# Patient Record
Sex: Female | Born: 1950 | Race: White | Hispanic: No | Smoking: Former smoker
Health system: Southern US, Community
[De-identification: ages and names within clinical notes are randomized; demographics above are authoritative.]

## PROBLEM LIST (undated history)

## (undated) DIAGNOSIS — M1990 Unspecified osteoarthritis, unspecified site: Secondary | ICD-10-CM

## (undated) DIAGNOSIS — K8020 Calculus of gallbladder without cholecystitis without obstruction: Secondary | ICD-10-CM

## (undated) DIAGNOSIS — K219 Gastro-esophageal reflux disease without esophagitis: Secondary | ICD-10-CM

## (undated) DIAGNOSIS — C50919 Malignant neoplasm of unspecified site of unspecified female breast: Secondary | ICD-10-CM

## (undated) DIAGNOSIS — K635 Polyp of colon: Secondary | ICD-10-CM

## (undated) DIAGNOSIS — K5792 Diverticulitis of intestine, part unspecified, without perforation or abscess without bleeding: Secondary | ICD-10-CM

## (undated) DIAGNOSIS — R519 Headache, unspecified: Secondary | ICD-10-CM

## (undated) DIAGNOSIS — R51 Headache: Secondary | ICD-10-CM

## (undated) DIAGNOSIS — T7840XA Allergy, unspecified, initial encounter: Secondary | ICD-10-CM

## (undated) DIAGNOSIS — E669 Obesity, unspecified: Secondary | ICD-10-CM

## (undated) HISTORY — PX: CHOLECYSTECTOMY: SHX55

## (undated) HISTORY — DX: Allergy, unspecified, initial encounter: T78.40XA

## (undated) HISTORY — DX: Malignant neoplasm of unspecified site of unspecified female breast: C50.919

## (undated) HISTORY — DX: Calculus of gallbladder without cholecystitis without obstruction: K80.20

## (undated) HISTORY — PX: MASTECTOMY: SHX3

## (undated) HISTORY — PX: TUBAL LIGATION: SHX77

## (undated) HISTORY — PX: ABDOMINAL HYSTERECTOMY: SHX81

## (undated) HISTORY — DX: Obesity, unspecified: E66.9

## (undated) HISTORY — DX: Diverticulitis of intestine, part unspecified, without perforation or abscess without bleeding: K57.92

## (undated) HISTORY — DX: Gastro-esophageal reflux disease without esophagitis: K21.9

## (undated) HISTORY — DX: Polyp of colon: K63.5

## (undated) HISTORY — DX: Unspecified osteoarthritis, unspecified site: M19.90

---

## 1998-05-24 ENCOUNTER — Other Ambulatory Visit: Admission: RE | Admit: 1998-05-24 | Discharge: 1998-05-24 | Payer: Self-pay | Admitting: Obstetrics and Gynecology

## 1998-07-15 ENCOUNTER — Encounter: Payer: Self-pay | Admitting: Internal Medicine

## 1998-07-15 ENCOUNTER — Ambulatory Visit (HOSPITAL_COMMUNITY): Admission: RE | Admit: 1998-07-15 | Discharge: 1998-07-15 | Payer: Self-pay | Admitting: Internal Medicine

## 1998-11-16 ENCOUNTER — Other Ambulatory Visit: Admission: RE | Admit: 1998-11-16 | Discharge: 1998-11-16 | Payer: Self-pay | Admitting: Obstetrics and Gynecology

## 1999-02-13 ENCOUNTER — Encounter (INDEPENDENT_AMBULATORY_CARE_PROVIDER_SITE_OTHER): Payer: Self-pay | Admitting: Specialist

## 1999-02-13 ENCOUNTER — Other Ambulatory Visit: Admission: RE | Admit: 1999-02-13 | Discharge: 1999-02-13 | Payer: Self-pay | Admitting: Obstetrics and Gynecology

## 1999-03-16 ENCOUNTER — Encounter: Payer: Self-pay | Admitting: Family Medicine

## 1999-03-16 ENCOUNTER — Encounter: Admission: RE | Admit: 1999-03-16 | Discharge: 1999-03-16 | Payer: Self-pay | Admitting: Family Medicine

## 1999-04-07 ENCOUNTER — Encounter: Admission: RE | Admit: 1999-04-07 | Discharge: 1999-04-07 | Payer: Self-pay | Admitting: Family Medicine

## 1999-04-07 ENCOUNTER — Encounter: Payer: Self-pay | Admitting: Family Medicine

## 1999-04-19 ENCOUNTER — Other Ambulatory Visit: Admission: RE | Admit: 1999-04-19 | Discharge: 1999-04-19 | Payer: Self-pay | Admitting: Family Medicine

## 1999-06-30 ENCOUNTER — Encounter (INDEPENDENT_AMBULATORY_CARE_PROVIDER_SITE_OTHER): Payer: Self-pay | Admitting: Specialist

## 1999-06-30 ENCOUNTER — Ambulatory Visit (HOSPITAL_COMMUNITY): Admission: RE | Admit: 1999-06-30 | Discharge: 1999-06-30 | Payer: Self-pay | Admitting: Gastroenterology

## 1999-09-08 ENCOUNTER — Encounter: Admission: RE | Admit: 1999-09-08 | Discharge: 1999-09-08 | Payer: Self-pay | Admitting: Family Medicine

## 1999-09-08 ENCOUNTER — Encounter: Payer: Self-pay | Admitting: Family Medicine

## 1999-09-26 ENCOUNTER — Other Ambulatory Visit: Admission: RE | Admit: 1999-09-26 | Discharge: 1999-09-26 | Payer: Self-pay | Admitting: Family Medicine

## 2000-12-10 ENCOUNTER — Other Ambulatory Visit: Admission: RE | Admit: 2000-12-10 | Discharge: 2000-12-10 | Payer: Self-pay | Admitting: Obstetrics and Gynecology

## 2001-07-08 ENCOUNTER — Encounter: Payer: Self-pay | Admitting: Internal Medicine

## 2002-12-23 ENCOUNTER — Encounter: Payer: Self-pay | Admitting: Internal Medicine

## 2003-01-22 ENCOUNTER — Ambulatory Visit (HOSPITAL_COMMUNITY): Admission: RE | Admit: 2003-01-22 | Discharge: 2003-01-22 | Payer: Self-pay | Admitting: Internal Medicine

## 2003-01-22 ENCOUNTER — Encounter (INDEPENDENT_AMBULATORY_CARE_PROVIDER_SITE_OTHER): Payer: Self-pay | Admitting: Specialist

## 2004-08-17 ENCOUNTER — Ambulatory Visit: Payer: Self-pay | Admitting: Internal Medicine

## 2006-08-12 ENCOUNTER — Ambulatory Visit (HOSPITAL_COMMUNITY): Admission: RE | Admit: 2006-08-12 | Discharge: 2006-08-12 | Payer: Self-pay | Admitting: Family Medicine

## 2006-11-14 ENCOUNTER — Ambulatory Visit (HOSPITAL_COMMUNITY): Admission: RE | Admit: 2006-11-14 | Discharge: 2006-11-14 | Payer: Self-pay | Admitting: Internal Medicine

## 2006-11-14 ENCOUNTER — Encounter (INDEPENDENT_AMBULATORY_CARE_PROVIDER_SITE_OTHER): Payer: Self-pay | Admitting: *Deleted

## 2006-11-29 ENCOUNTER — Encounter (INDEPENDENT_AMBULATORY_CARE_PROVIDER_SITE_OTHER): Payer: Self-pay | Admitting: General Surgery

## 2006-11-29 ENCOUNTER — Observation Stay (HOSPITAL_COMMUNITY): Admission: RE | Admit: 2006-11-29 | Discharge: 2006-11-30 | Payer: Self-pay | Admitting: General Surgery

## 2007-08-26 ENCOUNTER — Other Ambulatory Visit: Admission: RE | Admit: 2007-08-26 | Discharge: 2007-08-26 | Payer: Self-pay | Admitting: Emergency Medicine

## 2007-08-28 ENCOUNTER — Ambulatory Visit (HOSPITAL_COMMUNITY): Admission: RE | Admit: 2007-08-28 | Discharge: 2007-08-28 | Payer: Self-pay | Admitting: Internal Medicine

## 2008-03-08 ENCOUNTER — Telehealth: Payer: Self-pay | Admitting: Internal Medicine

## 2008-03-25 DIAGNOSIS — K219 Gastro-esophageal reflux disease without esophagitis: Secondary | ICD-10-CM | POA: Insufficient documentation

## 2008-03-29 ENCOUNTER — Ambulatory Visit: Payer: Self-pay | Admitting: Internal Medicine

## 2009-02-23 ENCOUNTER — Ambulatory Visit: Payer: Self-pay | Admitting: Internal Medicine

## 2009-04-12 ENCOUNTER — Encounter (INDEPENDENT_AMBULATORY_CARE_PROVIDER_SITE_OTHER): Payer: Self-pay | Admitting: *Deleted

## 2009-05-31 ENCOUNTER — Telehealth: Payer: Self-pay | Admitting: Internal Medicine

## 2009-07-04 ENCOUNTER — Encounter (INDEPENDENT_AMBULATORY_CARE_PROVIDER_SITE_OTHER): Payer: Self-pay

## 2009-07-05 ENCOUNTER — Ambulatory Visit: Payer: Self-pay | Admitting: Internal Medicine

## 2009-07-12 ENCOUNTER — Ambulatory Visit: Payer: Self-pay | Admitting: Internal Medicine

## 2009-07-13 ENCOUNTER — Encounter: Payer: Self-pay | Admitting: Internal Medicine

## 2010-02-27 ENCOUNTER — Telehealth: Payer: Self-pay | Admitting: Internal Medicine

## 2010-03-22 ENCOUNTER — Encounter (INDEPENDENT_AMBULATORY_CARE_PROVIDER_SITE_OTHER): Payer: Self-pay | Admitting: Obstetrics and Gynecology

## 2010-03-22 ENCOUNTER — Ambulatory Visit (HOSPITAL_COMMUNITY)
Admission: RE | Admit: 2010-03-22 | Discharge: 2010-03-23 | Payer: Self-pay | Source: Home / Self Care | Attending: Obstetrics and Gynecology | Admitting: Obstetrics and Gynecology

## 2010-04-30 ENCOUNTER — Encounter: Payer: Self-pay | Admitting: Family Medicine

## 2010-05-11 NOTE — Letter (Signed)
Summary: [facility] Instructions  [name]  [address] [place], [place] [address]   Phone: [phone]  Fax: [phone]       Shannon [name]    [birth year]    MRN: [id number]       Procedure Day Dorna Bloom:  Jake Shark  07/12/09     Arrival Time: 10:00AM     Procedure Time:  11:00AM     Location of Procedure:                    _ X_  [facility] (4th Floor)   PREPARATION FOR COLONOSCOPY WITH MIRALAX  Starting 5 days prior to your procedure 07/07/09 do not eat nuts, seeds, popcorn, corn, beans, peas,  salads, or any raw vegetables.  Do not take any fiber supplements (e.g. Metamucil, Citrucel, and Benefiber). ____________________________________________________________________________________________________   THE DAY BEFORE YOUR PROCEDURE         DATE: 07/11/09 DAY: MONDAY  1   Drink clear liquids the entire day-NO SOLID FOOD  2   Do not drink anything colored red or purple.  Avoid juices with pulp.  No orange juice.  3   Drink at least 64 oz. (8 glasses) of fluid/clear liquids during the day to prevent dehydration and help the prep work efficiently.  CLEAR LIQUIDS INCLUDE: Water Jello Ice Popsicles Tea (sugar ok, no milk/cream) Powdered fruit flavored drinks Coffee (sugar ok, no milk/cream) Gatorade Juice: apple, white grape, white cranberry  Lemonade Clear bullion, consomm, broth Carbonated beverages (any kind) Strained chicken noodle soup Hard Candy  4   Mix the entire bottle of Miralax with 64 oz. of Gatorade/Powerade in the morning and put in the refrigerator to chill.  5   At 3:00 pm take 2 Dulcolax/Bisacodyl tablets.  6   At 4:30 pm take one Reglan/Metoclopramide tablet.  7  Starting at 5:00 pm drink one 8 oz glass of the Miralax mixture every 15-20 minutes until you have finished drinking the entire 64 oz.  You should finish drinking prep around 7:30 or 8:00 pm.  8   If you are nauseated, you may take the 2nd Reglan/Metoclopramide  tablet at 6:30 pm.        9    At 8:00 pm take 2 more DULCOLAX/Bisacodyl tablets.     THE DAY OF YOUR PROCEDURE      DATE: 07/12/09  DAY: Jake Shark  You may drink clear liquids until 9:00AM (2 HOURS BEFORE PROCEDURE).   MEDICATION INSTRUCTIONS  Unless otherwise instructed, you should take regular prescription medications with a small sip of water as early as possible the morning of your procedure.          OTHER INSTRUCTIONS  You will need a responsible adult at least 59 years of age to accompany you and drive you home.   This person must remain in the waiting room during your procedure.  Wear loose fitting clothing that is easily removed.  Leave jewelry and other valuables at home.  However, you may wish to bring a book to read or an iPod/MP3 player to listen to music as you wait for your procedure to start.  Remove all body piercing jewelry and leave at home.  Total time from sign-in until discharge is approximately 2-3 hours.  You should go home directly after your procedure and rest.  You can resume normal activities the day after your procedure.  The day of your procedure you should not:   Drive   Make legal  decisions   Operate machinery   Drink alcohol   Return to work  You will receive specific instructions about eating, activities and medications before you leave.   The above instructions have been reviewed and explained to me by   Ulis Rias RN  July 05, 2009 5:14 PM     I fully understand and can verbalize these instructions _____________________________ Date _______

## 2010-05-11 NOTE — Letter (Signed)
Summary: Patient Notice-Endo Biopsy Results  [facility]  [address] [place] [address] [place], [place] [address]   Phone: [phone]  Fax: [phone]        July 13, 2009 MRN: [id number]    [name] [address] [place], [place]  [address]    Dear Shannon [name],  I am pleased to inform you that the biopsies taken during your recent endoscopic examination did not show any evidence of cancer upon pathologic examination.  Additional information/recommendations:  __No further action is needed at this time.  Please follow-up with      your primary care physician for your other healthcare needs.  __ Please call [phone] to schedule a return visit to review      your condition.  _x_ Continue with the treatment plan as outlined on the day of your      exam.     Please call us if you are having persistent problems or have questions about your condition that have not been fully answered at this time.  Sincerely,  Hart Carwin MD  This letter has been electronically signed by your physician.  Appended Document: Patient Notice-Endo Biopsy Results Letter mailed 4.8.11

## 2010-05-11 NOTE — Progress Notes (Signed)
Summary: TRIAGE-Scheduled Endo/Colon  Phone Note Call from Patient Call back at Home Phone [phone]   Caller: Patient Call For: Dr. Juanda Chance Reason for Call: Talk to Nurse Summary of Call: pt wants to talk to Dr. Juanda Chance about why she received a letter for her to sch a COL now, thought she wasnt due until later this year... checked in IDX and confirmed with pt that she isnt due until October... pt took this as me saying she couldnt speak with Dr. Juanda Chance and said I wasnt being helpful... apologized to pt and told her that I would be happy to take a mesg for Dr. Juanda Chance, I was just checking what our computer said as far as her COL due date... I then asked pt if she was experiencing any GI concerns right now and again pt became short with me and said "I just want to speak to Dr. Juanda Chance" Initial call taken by: Vallarie Mare,  May 31, 2009 1:59 PM  Follow-up for Phone Call        Pt. was seen in the office 02-23-2009. States she discussed having an Endo/Colon with Dr.Metha Kolasa, wants to know if she can get both procedures scheduled now.  DR.Princetta Uplinger PLEASE REVIEW AND ADVISE  Follow-up by: Laureen Ochs LPN,  May 31, 2009 2:06 PM  Additional Follow-up for Phone Call Additional follow up Details #1::        Per Dr.Kyland No, pt. may have an Endo/Colon, pt. wants it around 07-08-09. I will call her after 1pm today, she works nights and doesn't want to be disturbed until then. [phone]. Additional Follow-up by: Laureen Ochs LPN,  June 01, 2009 8:10 AM    Additional Follow-up for Phone Call Additional follow up Details #2::    Pt. scheduled her previsit for 07-05-09 at 4:30pm and her Endo/Colon in [facility] on 07-12-09 at 11am. Pt. instructed to call back as needed.  Follow-up by: Laureen Ochs LPN,  June 01, 2009 3:15 PM

## 2010-05-11 NOTE — Letter (Signed)
Summary: Patient Notice- Polyp Results  [facility]  [address] [place], [place] [address]   Phone: [phone]  Fax: [phone]        July 13, 2009 MRN: [id number]    Shannon [name] [address] [place], [place]  [address]    Dear Ms. Shannon [name],  I am pleased to inform you that the colon polyp(s) removed during your recent colonoscopy was (were) found to be benign (no cancer detected) upon pathologic examination.The polyps were hyperplastic ( not precancerous)  I recommend you have a repeat colonoscopy examination in 7_ years to look for recurrent polyps, as having colon polyps increases your risk for having recurrent polyps or even colon cancer in the future.  Should you develop new or worsening symptoms of abdominal pain, bowel habit changes or bleeding from the rectum or bowels, please schedule an evaluation with either your primary care physician or with me.  Additional information/recommendations:  _x_ No further action with gastroenterology is needed at this time. Please      follow-up with your primary care physician for your other healthcare      needs.  __ Please call [phone] to schedule a return visit to review your      situation.  __ Please keep your follow-up visit as already scheduled.  __ Continue treatment plan as outlined the day of your exam.  Please call us if you are having persistent problems or have questions about your condition that have not been fully answered at this time.  Sincerely,  Hart Carwin MD  This letter has been electronically signed by your physician.  Appended Document: Patient Notice- Polyp Results Letter mailed 4.8.11

## 2010-05-11 NOTE — Progress Notes (Signed)
Summary: Refill  Phone Note Call from Patient Call back at Home Phone [phone]   Caller: Patient Call For: Dr. Juanda Chance Reason for Call: Refill Medication Details for Reason: Refill Summary of Call: Needs Nexium refilled for one year. [facility] [address]  Please call pt. if this can be done. Initial call taken by: Schuyler Amor,  February 27, 2010 10:27 AM  Follow-up for Phone Call        Prescription for Nexium has been sent with 10 refills. I have left a message on patient's voicemail that this has been done. Before she runs out of her last refill, she will need to schedule an appointment with Dr Juanda Chance for a routine follow up. Follow-up by: Lamona Curl CMA Duncan Dull),  February 27, 2010 10:39 AM    New/Updated Medications: NEXIUM 40 MG CPDR (ESOMEPRAZOLE MAGNESIUM) Take 1 tablet by mouth once a day Prescriptions: NEXIUM 40 MG CPDR (ESOMEPRAZOLE MAGNESIUM) Take 1 tablet by mouth once a day  #30 x 10   Entered by:   Lamona Curl CMA (AAMA)   Authorized by:   Hart Carwin MD   Signed by:   Lamona Curl CMA (AAMA) on 02/27/2010   Method used:   [facility]  [phone]* (retail)       [address] [place], [place]  [address]       Ph: [phone] or [phone]       Fax: [phone]   RxID:   [id number]

## 2010-05-11 NOTE — Procedures (Signed)
Summary: Upper Endoscopy  Patient: Shannon [name] Note: All result statuses are Final unless otherwise noted.  Tests: (1) Upper Endoscopy (EGD)   EGD Upper Endoscopy       DONE     [facility]     [address]     [place], [place]  [address]           ENDOSCOPY PROCEDURE REPORT           PATIENT:  [name], [name]  MR#:  [id number]     BIRTHDATE:  1950-05-31, 59 yrs. old  GENDER:  female           ENDOSCOPIST:  Hedwig Morton. Juanda Chance, MD     Referred by:  Lupita Raider, M.D.           PROCEDURE DATE:  07/12/2009     PROCEDURE:  EGD with biopsy     ASA CLASS:  Class II     INDICATIONS:  GERD, heartburn EGD 2000 for GERD, currently     controlled on Nexiem 40 mg qd           MEDICATIONS:   Versed 7 mg, Fentanyl 50 mcg     TOPICAL ANESTHETIC:  Exactacain Spray           DESCRIPTION OF PROCEDURE:   After the risks benefits and     alternatives of the procedure were thoroughly explained, informed     consent was obtained.  The LB GIF-H180 [id number] endoscope was     introduced through the mouth and advanced to the second portion of     the duodenum, without limitations.  The instrument was slowly     withdrawn as the mucosa was fully examined.     <<PROCEDUREIMAGES>>           A hiatal hernia was found (see image1). 2 cm reducible hiatal     hernia  Normal GE junction was noted. With standard forceps, a     biopsy was obtained and sent to pathology (see image2, image7, and     image6).  Mild gastritis was found in the body and the antrum of     the stomach. erythema and patchiness With standard forceps, a     biopsy was obtained and sent to pathology (see image4 and image5).     Otherwise the examination was normal (see image3).    Retroflexed     views revealed no abnormalities.    The scope was then withdrawn     from the patient and the procedure completed.           COMPLICATIONS:  None           ENDOSCOPIC IMPRESSION:     1) Hiatal hernia     2) Normal GE  junction     3) Mild gastritis in the body and the antrum of the stomach     4) Otherwise normal examination     small reducible hernia, symptoms of GERD control on Nexiem 40 mg     qd     RECOMMENDATIONS:     1) Anti-reflux regimen to be follow     2) Await biopsy results     nexiem 40 mg po qd           REPEAT EXAM:  In 0 year(s) for.           ______________________________     Hedwig Morton. Juanda Chance, MD  CC:           n.     eSIGNED:   Neola Worrall M. Santos Sollenberger at 07/12/2009 12:06 PM           Manvi, Guilliams, [id number]  Note: An exclamation mark (!) indicates a result that was not dispersed into the flowsheet. Document Creation Date: 07/12/2009 12:07 PM _______________________________________________________________________  (1) Order result status: Final Collection or observation date-time: 07/12/2009 11:34 Requested date-time:  Receipt date-time:  Reported date-time:  Referring Physician:   Ordering Physician: Lina Sar [phone]) Specimen Source:  Source: Launa Grill Order Number: [id number] Lab site:

## 2010-05-11 NOTE — Letter (Signed)
Summary: Colonoscopy Date Change Letter  [facility]  [address] [place], [place] [address]   Phone: [phone]  Fax: [phone]      April 12, 2009 MRN: [id number]   RABIAH GOESER [address] [place], [place]  [address]   Dear Ms. Rogelia Mire,   Previously you were recommended to have a repeat colonoscopy around this time. Your chart was recently reviewed by Dr. Hedwig Morton. Juanda Chance of [facility]. Follow up colonoscopy is now recommended in October 2011. This revised recommendation is based on current, nationally recognized guidelines for colorectal cancer screening and polyp surveillance. These guidelines are endorsed by the American Cancer Society, [employer] on Colorectal Cancer as well as numerous other major medical organizations.  Please understand that our recommendation assumes that you do not have any new symptoms such as bleeding, a change in bowel habits, anemia, or significant abdominal discomfort. If you do have any concerning GI symptoms or want to discuss the guideline recommendations, please call to arrange an office visit at your earliest convenience. Otherwise we will keep you in our reminder system and contact you 1-2 months prior to the date listed above to schedule your next colonoscopy.  Thank you,  Hedwig Morton. Juanda Chance, M.D.  [facility] [phone]

## 2010-05-11 NOTE — Miscellaneous (Signed)
Summary: [facility] previsit  Clinical Lists Changes  Medications: Added new medication of MIRALAX   POWD (POLYETHYLENE GLYCOL 3350) As per prep  instructions. - Signed Added new medication of REGLAN 10 MG  TABS (METOCLOPRAMIDE HCL) As per prep instructions. - Signed Added new medication of DULCOLAX 5 MG  TBEC (BISACODYL) Day before procedure take 2 at 3pm and 2 at 8pm. - Signed Rx of MIRALAX   POWD (POLYETHYLENE GLYCOL 3350) As per prep  instructions.;  #255gm x 0;  Signed;  Entered by: Ulis Rias RN;  Authorized by: Hart Carwin MD;  Method used: [facility]  [phone]*, [address], [place], [place]  [address], Ph: [phone] or [phone], Fax: [phone] Rx of REGLAN 10 MG  TABS (METOCLOPRAMIDE HCL) As per prep instructions.;  #2 x 0;  Signed;  Entered by: Ulis Rias RN;  Authorized by: Hart Carwin MD;  Method used: Electronically to [facility]  [phone]*, [address], [place], [place]  [address], Ph: [phone] or [phone], Fax: [phone] Rx of DULCOLAX 5 MG  TBEC (BISACODYL) Day before procedure take 2 at 3pm and 2 at 8pm.;  #4 x 0;  Signed;  Entered by: Ulis Rias RN;  Authorized by: Hart Carwin MD;  Method used: Electronically to [facility]  [phone]*, [address], [place], [place]  [address], Ph: [phone] or [phone], Fax: [phone] Observations: Added new observation of ALLERGY REV: Done (07/05/2009 16:43)    Prescriptions: DULCOLAX 5 MG  TBEC (BISACODYL) Day before procedure take 2 at 3pm and 2 at 8pm.  #4 x 0   Entered by:   Ulis Rias RN   Authorized by:   Hart Carwin MD   Signed by:   Ulis Rias RN on 07/05/2009   Method used:   Electronically to        [facility]  [phone]* (retail)       [address] [place], [place]  [address]       Ph: [phone] or [phone]       Fax: [phone]   RxID:   [id number] REGLAN 10 MG  TABS (METOCLOPRAMIDE HCL) As per prep instructions.  #2 x 0  Entered by:   Ulis Rias RN   Authorized by:   Hart Carwin MD   Signed by:   Ulis Rias RN on 07/05/2009   Method used:   Electronically to        [facility]  [phone]* (retail)       [address] [place], [place]  [address]       Ph: [phone] or [phone]       Fax: [phone]   RxID:   [id number] MIRALAX   POWD (POLYETHYLENE GLYCOL 3350) As per prep  instructions.  #255gm x 0   Entered by:   Ulis Rias RN   Authorized by:   Hart Carwin MD   Signed by:   Ulis Rias RN on 07/05/2009   Method used:   Electronically to        [facility]  [phone]* (retail)       [address] [place], [place]  [address]       Ph: [phone] or [phone]       Fax: [phone]   RxID:   [id number]

## 2010-05-11 NOTE — Procedures (Signed)
Summary: Colonoscopy  Patient: Shannon [name] Note: All result statuses are Final unless otherwise noted.  Tests: (1) Colonoscopy (COL)   COL Colonoscopy           DONE     [facility]     [address]     [place], [place]  [address]           COLONOSCOPY PROCEDURE REPORT           PATIENT:  Shannon [name], Shannon [name]  MR#:  [id number]     BIRTHDATE:  1950-04-18, 59 yrs. old  GENDER:  female     ENDOSCOPIST:  Hedwig Morton. Juanda Chance, MD     REF. BY:  Lupita Raider, M.D.     PROCEDURE DATE:  07/12/2009     PROCEDURE:  Colonoscopy 25366     ASA CLASS:  Class I     INDICATIONS:  Elevated Risk Screening adenom. polyp 2000,     hyperplastic polyp in 2004     MEDICATIONS:   Versed 5 mg, Fentanyl 50 mcg           DESCRIPTION OF PROCEDURE:   After the risks benefits and     alternatives of the procedure were thoroughly explained, informed     consent was obtained.  Digital rectal exam was performed and     revealed no rectal masses.   The LB PCF-H180AL [id number] endoscope     was introduced through the anus and advanced to the cecum, which     was identified by both the appendix and ileocecal valve, without     limitations.  The quality of the prep was good, using MiraLax.     The instrument was then slowly withdrawn as the colon was fully     examined.     <<PROCEDUREIMAGES>>           FINDINGS:  Four polyps were found. diminutive polyps rectosigmoid     2-3 mm, x 4 The polyps were removed using cold biopsy forceps (see     image14, image13, and image12).  Moderate diverticulosis was found     throughout the colon (see image1, image2, and image3).  This was     otherwise a normal examination of the colon (see image15, image10,     image9, image7, image6, and image4). unable to enter cecal pouch     but achieved full vis of the pouch including appendiceal opening     Retroflexed views in the rectum revealed no abnormalities.    The     scope was then withdrawn from the patient and  the procedure     completed.           COMPLICATIONS:  None     ENDOSCOPIC IMPRESSION:     1) Four polyps     2) Moderate diverticulosis throughout the colon     3) Otherwise normal examination     diminutive polyps removed with cold biopsy     RECOMMENDATIONS:     1) Await pathology results     2) High fiber diet.     REPEAT EXAM:  In 7 year(s) for.           ______________________________     Hedwig Morton. Juanda Chance, MD           CC:           n.     eSIGNED:   Hedwig Morton. Rashena Dowling at 07/12/2009 12:17 PM  Shannon [name], Shannon [name], [id number]  Note: An exclamation mark (!) indicates a result that was not dispersed into the flowsheet. Document Creation Date: 07/12/2009 12:18 PM _______________________________________________________________________  (1) Order result status: Final Collection or observation date-time: 07/12/2009 11:57 Requested date-time:  Receipt date-time:  Reported date-time:  Referring Physician:   Ordering Physician: Lina Sar [phone]) Specimen Source:  Source: Launa Grill Order Number: [id number] Lab site:   Appended Document: Colonoscopy     Procedures Next Due Date:    Colonoscopy: 07/2016

## 2010-06-19 LAB — CBC
Hemoglobin: 13 g/dL (ref 12.0–15.0)
MCH: 32.9 pg (ref 26.0–34.0)
MCHC: 33.4 g/dL (ref 30.0–36.0)
Platelets: 265 10*3/uL (ref 150–400)
RDW: 12.4 % (ref 11.5–15.5)

## 2010-06-19 LAB — COMPREHENSIVE METABOLIC PANEL
Albumin: 3.3 g/dL — ABNORMAL LOW (ref 3.5–5.2)
Alkaline Phosphatase: 61 U/L (ref 39–117)
BUN: 6 mg/dL (ref 6–23)
CO2: 27 mEq/L (ref 19–32)
Chloride: 101 mEq/L (ref 96–112)
Creatinine, Ser: 0.81 mg/dL (ref 0.4–1.2)
GFR calc non Af Amer: >60 mL/min (ref >60)
Potassium: 3.5 mEq/L (ref 3.5–5.1)
Total Bilirubin: 0.7 mg/dL (ref 0.3–1.2)

## 2010-06-20 LAB — SURGICAL PCR SCREEN
MRSA, PCR: NEGATIVE
Staphylococcus aureus: NEGATIVE

## 2010-06-20 LAB — CBC
Hemoglobin: 14.9 g/dL (ref 12.0–15.0)
MCH: 34.1 pg — ABNORMAL HIGH (ref 26.0–34.0)
MCHC: 34.2 g/dL (ref 30.0–36.0)
Platelets: 274 10*3/uL (ref 150–400)
RBC: 4.37 MIL/uL (ref 3.87–5.11)

## 2010-08-22 NOTE — Op Note (Signed)
Shannon [name], Shannon [name]         ACCOUNT NO.:  [id number]   MEDICAL RECORD NO.:  [id number]          PATIENT TYPE:  OBV   LOCATION:  A320                          FACILITY:  [facility]   PHYSICIAN:  Barbaraann Barthel, M.D. DATE OF BIRTH:  07/15/1950   DATE OF PROCEDURE:  11/29/2006  DATE OF DISCHARGE:                               OPERATIVE REPORT   SURGEON:  Dr. Malvin Johns.   PREOPERATIVE DIAGNOSIS:  Cholecystitis, cholelithiasis.   POSTOPERATIVE DIAGNOSIS:  Cholecystitis, cholelithiasis.   PROCEDURE:  Laparoscopic cholecystectomy.   SPECIMEN:  The gallbladder with stones.   NOTE:  This is a 60 year old white female who had recurrent episodes of  right upper quadrant pain with nausea for at least 5 years duration.  This pain was postprandial in nature and radiated to her back.  She was  noted on sonography to have cholelithiasis.   She [facility] by the [facility] for [facility].  Her liver  function studies were grossly within normal limits preoperatively.  We  discussed surgery with her, discussing complications not limited to but  including bleeding, infection, damage to bile ducts, perforation of  organs, transitory diarrhea, and the possibility that an open  cholecystectomy may be needed.  Informed consent was obtained.   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 Right upper quadrant otherwise appeared to be normal.  Cystic duct was of normal size. This was not cannulated. Rest of upper  quadrant appeared to be within normal limits.   TECHNIQUE:  The patient was placed in supine position after the adequate  administration of general anesthesia via endotracheal intubation, her  entire abdomen was prepped with Betadine solution and draped in usual  manner.  Prior to this a Foley catheter was aseptically inserted.  We  then grasped the gallbladder took down  its adhesions, identified the  cystic duct, triply silver clipped this then divided this as we did this  to the cystic artery.  We then removed the gallbladder from the liver  bed uneventfully using the hook cautery device.  There was minimal  spillage.  We then removed gallbladder with the Endosac device.  I  checked for hemostasis with a cautery device, irrigated with normal  saline solution and elected to leave Surgicel within the liver bed as  well as the Jackson-Pratt drain which exited through one of the lateral  5-mm cannula sites.  After checking for hemostasis and deeming this  complete, the abdomen was then desufflated. The drain was sutured in  place with 3-0 nylon.  0 Polysorb was used to close the cannula sites  and the area of the epigastrium and the umbilicus and then I used 0.5%  Sensorcaine around all port  sites help with postoperative discomfort and the skin was approximated  with stapling device.  Prior to closure all sponge, needle instrument  counts found to be correct.  Estimated blood loss was minimal.  The  patient tolerated the procedure well and was taken to the recovery room  in satisfactory condition.  Barbaraann Barthel, M.D.  Electronically Signed     WB/MEDQ  D:  11/29/2006  T:  11/29/2006  Job:  [id number]   cc:   [facility]

## 2010-08-25 NOTE — Op Note (Signed)
   NAME:  Shannon [name], Shannon [name]                   ACCOUNT NO.:  [id number]   MEDICAL RECORD NO.:  [id number]                   PATIENT TYPE:  AMB   LOCATION:  ENDO                                 FACILITY:  [facility]   PHYSICIAN:  Lina Sar, M.D. [facility]               DATE OF BIRTH:  February 05, 1951   DATE OF PROCEDURE:  01/22/2003  DATE OF DISCHARGE:                                 OPERATIVE REPORT   PROCEDURE:  Colonoscopy.   INDICATIONS FOR PROCEDURE:  This 60 year old white female with chronic  gastroesophageal reflux is undergoing colonoscopy for followup of  adenomatous polyp of the colon which was found on colonoscopy March 2001.  She has had left lower quadrant tenderness, her stool was Hemoccult  negative. She also has irritable bowel syndrome complaining of intermittent  bloating. There is no family history of colon cancer.   ENDOSCOPE:  Olympus single channel video endoscope.   SEDATION:  1. Versed 7 mg IV.  2. Fentanyl 100 mcg IV.   FINDINGS:  The Olympus single channel videoscope passed under direct vision  through the rectum to the sigmoid colon. The patient was monitored by pulse  oximeter. Her oxygen saturations were normal.   The rectal ampulla was unremarkable. There were numerous diverticula in the  left colon most of them very shallow. The holster folds were normal and  there was no narrowing in the lumen. At the level of 90 cm from the rectum  was a 3 mm diminutive polyp which was removed with cold biopsies and sent to  pathology. The transverse colon, hepatic flexure and ascending colon was  unremarkable. The mucosa appeared normal. There were no additional polyps.  The cecal pouch was reached without difficulty. It showed normal appendical  opening and ileocecal valve. The colonoscope was retracted, the colon  decompressed. The patient tolerated the procedure well.   IMPRESSION:  1. Mild left colon diverticulosis.  2. Diminutive polyp at 90 cm, status post  polypectomy.    PLAN:  1. Post polypectomy instructions.  2. High fiber diet with fiber supplements.  3. Recall colonoscopy in five years.                                               Lina Sar, M.D. [facility]    DB/MEDQ  D:  01/22/2003  T:  01/22/2003  Job:  [id number]   cc:   Donia Guiles, M.D.  [address] [place]  [place] [address]  Fax: [phone]

## 2011-01-19 LAB — BASIC METABOLIC PANEL
CO2: 26
Calcium: 9.1
Chloride: 110
GFR calc Af Amer: >60
GFR calc non Af Amer: >60
GFR calc non Af Amer: >60
Glucose, Bld: 99
Potassium: 3.8
Potassium: 3.8
Sodium: 139
Sodium: 144

## 2011-01-19 LAB — DIFFERENTIAL
Basophils Absolute: 0
Basophils Absolute: 0
Basophils Relative: 1
Eosinophils Absolute: 0.1
Eosinophils Relative: 1
Lymphocytes Relative: 38
Lymphocytes Relative: 42
Monocytes Absolute: 0.7
Neutro Abs: 3.6
Neutrophils Relative %: 48

## 2011-01-19 LAB — CBC
HCT: 39.1
Hemoglobin: 13.5
MCHC: 34.5
MCV: 97.9
RBC: 4.22
RDW: 12.7
RDW: 12.9

## 2011-01-19 LAB — HEPATIC FUNCTION PANEL
ALT: 53 — ABNORMAL HIGH
AST: 29
Albumin: 3.7
Alkaline Phosphatase: 68
Bilirubin, Direct: 0.1
Bilirubin, Direct: 0.1
Indirect Bilirubin: 0.9
Total Protein: 6.6

## 2011-01-19 LAB — AMYLASE: Amylase: 30

## 2011-02-06 ENCOUNTER — Other Ambulatory Visit: Payer: Self-pay | Admitting: Obstetrics and Gynecology

## 2011-02-16 ENCOUNTER — Other Ambulatory Visit: Payer: Self-pay | Admitting: Internal Medicine

## 2011-02-18 ENCOUNTER — Other Ambulatory Visit: Payer: Self-pay | Admitting: Internal Medicine

## 2011-08-14 ENCOUNTER — Other Ambulatory Visit: Payer: Self-pay | Admitting: Internal Medicine

## 2011-09-18 ENCOUNTER — Telehealth: Payer: Self-pay | Admitting: *Deleted

## 2011-09-18 ENCOUNTER — Other Ambulatory Visit: Payer: Self-pay | Admitting: Internal Medicine

## 2011-09-18 MED ORDER — ESOMEPRAZOLE MAGNESIUM 40 MG PO CPDR: 40.0000 mg | DELAYED_RELEASE_CAPSULE | Freq: Every day | ORAL | Status: DC

## 2011-09-18 NOTE — Telephone Encounter (Signed)
Left message for patient to call back if she needs to. I have advised that she has not been seen in over 2 years and needs an appointment for further refills.

## 2011-09-18 NOTE — Telephone Encounter (Signed)
Left message (okay with patient per our previous conversation) to let patient know that Dr Shannon [name] will approve 6 months of Nexium, however, she needs an office visit in 6 months for any further refills.

## 2011-09-18 NOTE — Telephone Encounter (Signed)
Patient requests refills on her Nexium. I have advised her that she is due for an office visit. The last time we saw her for endo was 07/2009 and the last time we have seen her in the office was 02/23/09. Endo showed mild gastritis. Patient states that she cannot come for an appointment because her deductible is too high. States she saw her PCP less than 1 year ago so he may give her the medication if we cant. Dr Juanda Chance, do you want me to give refills or does she need office visit?

## 2011-09-18 NOTE — Telephone Encounter (Signed)
OK to refill Nexiem x 6

## 2012-07-28 ENCOUNTER — Other Ambulatory Visit: Payer: Self-pay | Admitting: Family Medicine

## 2012-07-28 DIAGNOSIS — R109 Unspecified abdominal pain: Secondary | ICD-10-CM

## 2012-07-29 ENCOUNTER — Ambulatory Visit
Admission: RE | Admit: 2012-07-29 | Discharge: 2012-07-29 | Disposition: A | Discharge status: Home / Self Care | Payer: BC Managed Care – PPO | Source: Ambulatory Visit | Attending: Family Medicine | Admitting: Family Medicine

## 2012-07-29 DIAGNOSIS — R109 Unspecified abdominal pain: Secondary | ICD-10-CM

## 2012-07-29 MED ORDER — IOHEXOL 300 MG/ML  SOLN
100.0000 mL | Freq: Once | INTRAMUSCULAR | Status: AC | PRN
  Administered 2012-07-29: 100 mL via INTRAVENOUS

## 2013-04-16 ENCOUNTER — Other Ambulatory Visit: Payer: Self-pay | Admitting: Physician Assistant

## 2013-12-22 ENCOUNTER — Other Ambulatory Visit: Payer: Self-pay | Admitting: Physician Assistant

## 2014-07-16 ENCOUNTER — Encounter (HOSPITAL_COMMUNITY): Payer: Self-pay

## 2014-07-16 ENCOUNTER — Emergency Department (HOSPITAL_COMMUNITY)
Admission: EM | Admit: 2014-07-16 | Discharge: 2014-07-16 | Disposition: A | Discharge status: Home / Self Care | Payer: 59 | Attending: Emergency Medicine | Admitting: Emergency Medicine

## 2014-07-16 ENCOUNTER — Emergency Department (HOSPITAL_COMMUNITY): Payer: 59

## 2014-07-16 DIAGNOSIS — Z9089 Acquired absence of other organs: Secondary | ICD-10-CM | POA: Diagnosis not present

## 2014-07-16 DIAGNOSIS — Z79899 Other long term (current) drug therapy: Secondary | ICD-10-CM | POA: Insufficient documentation

## 2014-07-16 DIAGNOSIS — R0602 Shortness of breath: Secondary | ICD-10-CM | POA: Diagnosis not present

## 2014-07-16 DIAGNOSIS — K2100 Gastro-esophageal reflux disease with esophagitis, without bleeding: Secondary | ICD-10-CM

## 2014-07-16 DIAGNOSIS — Z791 Long term (current) use of non-steroidal anti-inflammatories (NSAID): Secondary | ICD-10-CM | POA: Insufficient documentation

## 2014-07-16 DIAGNOSIS — Z7982 Long term (current) use of aspirin: Secondary | ICD-10-CM | POA: Diagnosis not present

## 2014-07-16 DIAGNOSIS — Z853 Personal history of malignant neoplasm of breast: Secondary | ICD-10-CM | POA: Diagnosis not present

## 2014-07-16 DIAGNOSIS — R0789 Other chest pain: Secondary | ICD-10-CM | POA: Insufficient documentation

## 2014-07-16 DIAGNOSIS — Z9851 Tubal ligation status: Secondary | ICD-10-CM | POA: Insufficient documentation

## 2014-07-16 DIAGNOSIS — Z90710 Acquired absence of both cervix and uterus: Secondary | ICD-10-CM | POA: Insufficient documentation

## 2014-07-16 DIAGNOSIS — R079 Chest pain, unspecified: Secondary | ICD-10-CM | POA: Diagnosis present

## 2014-07-16 LAB — BASIC METABOLIC PANEL
ANION GAP: 9 (ref 5–15)
BUN: 15 mg/dL (ref 6–23)
CO2: 23 mmol/L (ref 19–32)
CREATININE: 0.93 mg/dL (ref 0.50–1.10)
Calcium: 9.9 mg/dL (ref 8.4–10.5)
Chloride: 108 mmol/L (ref 96–112)
GFR calc Af Amer: 74 mL/min — ABNORMAL LOW (ref >90)
GFR calc non Af Amer: 64 mL/min — ABNORMAL LOW (ref >90)
Glucose, Bld: 120 mg/dL — ABNORMAL HIGH (ref 70–99)
Potassium: 4 mmol/L (ref 3.5–5.1)
Sodium: 140 mmol/L (ref 135–145)

## 2014-07-16 LAB — HEPATIC FUNCTION PANEL
ALT: 24 U/L (ref 0–35)
AST: 28 U/L (ref 0–37)
Albumin: 4.3 g/dL (ref 3.5–5.2)
Alkaline Phosphatase: 80 U/L (ref 39–117)
Bilirubin, Direct: 0.1 mg/dL (ref 0.0–0.5)
Indirect Bilirubin: 0.7 mg/dL (ref 0.3–0.9)
Total Bilirubin: 0.8 mg/dL (ref 0.3–1.2)
Total Protein: 8.4 g/dL — ABNORMAL HIGH (ref 6.0–8.3)

## 2014-07-16 LAB — CBC
HEMATOCRIT: 48.9 % — AB (ref 36.0–46.0)
HEMOGLOBIN: 17 g/dL — AB (ref 12.0–15.0)
MCH: 34.1 pg — AB (ref 26.0–34.0)
MCHC: 34.8 g/dL (ref 30.0–36.0)
MCV: 98 fL (ref 78.0–100.0)
Platelets: 306 10*3/uL (ref 150–400)
RBC: 4.99 MIL/uL (ref 3.87–5.11)
RDW: 12.4 % (ref 11.5–15.5)
WBC: 10.7 10*3/uL — ABNORMAL HIGH (ref 4.0–10.5)

## 2014-07-16 LAB — LIPASE, BLOOD: Lipase: 18 U/L (ref 11–59)

## 2014-07-16 LAB — I-STAT TROPONIN, ED: Troponin i, poc: 0 ng/mL (ref 0.00–0.08)

## 2014-07-16 LAB — TROPONIN I

## 2014-07-16 MED ORDER — GI COCKTAIL ~~LOC~~
30.0000 mL | Freq: Once | ORAL | Status: AC
  Administered 2014-07-16: 30 mL via ORAL
  Filled 2014-07-16: qty 30

## 2014-07-16 MED ORDER — ASPIRIN 325 MG PO TABS
325.0000 mg | ORAL_TABLET | Freq: Once | ORAL | Status: AC
  Administered 2014-07-16: 325 mg via ORAL
  Filled 2014-07-16: qty 1

## 2014-07-16 MED ORDER — ONDANSETRON HCL 4 MG/2ML IJ SOLN: 4.0000 mg | Freq: Once | INTRAMUSCULAR | Status: DC

## 2014-07-16 MED ORDER — ASPIRIN 81 MG PO CHEW: 81.0000 mg | CHEWABLE_TABLET | Freq: Every day | ORAL | Status: DC

## 2014-07-16 MED ORDER — ONDANSETRON HCL 4 MG PO TABS: 4.0000 mg | ORAL_TABLET | Freq: Four times a day (QID) | ORAL | Status: DC

## 2014-07-16 MED ORDER — PANTOPRAZOLE SODIUM 40 MG PO TBEC
40.0000 mg | DELAYED_RELEASE_TABLET | Freq: Every day | ORAL | Status: DC
  Administered 2014-07-16: 40 mg via ORAL
  Filled 2014-07-16: qty 1

## 2014-07-16 MED ORDER — ONDANSETRON HCL 4 MG/2ML IJ SOLN
4.0000 mg | Freq: Once | INTRAMUSCULAR | Status: AC
  Administered 2014-07-16: 4 mg via INTRAVENOUS
  Filled 2014-07-16: qty 2

## 2014-07-16 MED ORDER — ONDANSETRON 8 MG PO TBDP
8.0000 mg | ORAL_TABLET | Freq: Once | ORAL | Status: AC
  Administered 2014-07-16: 8 mg via ORAL
  Filled 2014-07-16: qty 1

## 2014-07-16 NOTE — ED Notes (Signed)
EKG delayed.  Pt actively vomiting.  Artifact due to movement

## 2014-07-16 NOTE — Discharge Instructions (Signed)

## 2014-07-16 NOTE — ED Provider Notes (Signed)
MSE was initiated and I personally evaluated the patient and placed orders (if any) at  2:24 PM on July 16, 2014.  The patient appears stable so that the remainder of the MSE may be completed by another provider.  Ripley Fraise, MD 07/16/14 [phone]

## 2014-07-16 NOTE — ED Notes (Signed)
IV removed, cath intact.  Pt was in no distress and glad to be leaving.  Follow up discussed

## 2014-07-16 NOTE — ED Provider Notes (Signed)
CSN: [id number]     Arrival date & time 07/16/14  1401 History   First MD Initiated Contact with Patient 07/16/14 1424     Chief Complaint  Patient presents with  . Chest Pain  . Shortness of Breath     (Consider location/radiation/quality/duration/timing/severity/associated sxs/prior Treatment) HPI Comments: Pt is a 64 y.o. Female presenting with episodic lower abdominal pain with radiation up to chest, with assoc n/v, and episode of diaphoresis at lunch today. She has had 3 episodes today relieved by vomiting. No hx of similar, though has had reflux in the past. She is s/p cholecystectomy. She had nml stress test >10 yrs ago.    Patient is a 65 y.o. female presenting with chest pain and shortness of breath.  Chest Pain Associated symptoms: shortness of breath   Associated symptoms: no abdominal pain, no back pain, no cough, no diaphoresis, no fatigue, no fever, no headache, no nausea, no numbness, no palpitations, not vomiting and no weakness   Shortness of Breath Associated symptoms: chest pain   Associated symptoms: no abdominal pain, no cough, no diaphoresis, no fever, no headaches, no neck pain, no sore throat and no vomiting     Past Medical History  Diagnosis Date  . Cancer     breast   Past Surgical History  Procedure Laterality Date  . Abdominal hysterectomy    . Tubal ligation    . Cholecystectomy     History reviewed. No pertinent family history. History  Substance Use Topics  . Smoking status: Never Smoker   . Smokeless tobacco: Not on file  . Alcohol Use: No   OB History    No data available     Review of Systems  Constitutional: Negative for fever, chills, diaphoresis, activity change, appetite change and fatigue.  HENT: Negative for congestion, facial swelling, rhinorrhea and sore throat.   Eyes: Negative for photophobia and discharge.  Respiratory: Positive for shortness of breath. Negative for cough and chest tightness.   Cardiovascular: Positive for  chest pain. Negative for palpitations and leg swelling.  Gastrointestinal: Negative for nausea, vomiting, abdominal pain and diarrhea.  Endocrine: Negative for polydipsia and polyuria.  Genitourinary: Negative for dysuria, frequency, difficulty urinating and pelvic pain.  Musculoskeletal: Negative for back pain, arthralgias, neck pain and neck stiffness.  Skin: Negative for color change and wound.  Allergic/Immunologic: Negative for immunocompromised state.  Neurological: Negative for facial asymmetry, weakness, numbness and headaches.  Hematological: Does not bruise/bleed easily.  Psychiatric/Behavioral: Negative for confusion and agitation.      Allergies  Codeine  Home Medications   Prior to Admission medications   Medication Sig Start Date End Date Taking? Authorizing Provider  CALCIUM PO Take 1 tablet by mouth daily.   Yes Historical Provider, MD  cetirizine (ZYRTEC) 10 MG tablet Take 10 mg by mouth daily as needed for allergies.   Yes Historical Provider, MD  meloxicam (MOBIC) 15 MG tablet Take 15 mg by mouth daily.   Yes Historical Provider, MD  Multiple Vitamin (MULTIVITAMIN WITH MINERALS) TABS tablet Take 1 tablet by mouth daily.   Yes Historical Provider, MD  Omega-3 Fatty Acids (FISH OIL PO) Take 1 tablet by mouth daily.   Yes Historical Provider, MD  omeprazole (PRILOSEC) 40 MG capsule Take 40 mg by mouth at bedtime.   Yes Historical Provider, MD  phentermine (ADIPEX-P) 37.5 MG tablet Take 37.5 mg by mouth daily before breakfast.   Yes Historical Provider, MD  Polyethyl Glycol-Propyl Glycol (SYSTANE OP) Apply 1-2 drops  to eye daily as needed (dry eyes).   Yes Historical Provider, MD  aspirin 81 MG chewable tablet Chew 1 tablet (81 mg total) by mouth daily. 07/16/14   Ernestina Patches, MD  esomeprazole (NEXIUM) 40 MG capsule Take 1 capsule (40 mg total) by mouth daily before breakfast. Patient not taking: Reported on 07/16/2014 09/18/11   Lafayette Dragon, MD  ondansetron (ZOFRAN) 4  MG tablet Take 1 tablet (4 mg total) by mouth every 6 (six) hours. 07/16/14   Ernestina Patches, MD   BP 132/83 mmHg  Pulse 96  Temp(Src) 98 F (36.7 C) (Oral)  Resp 16  SpO2 96% Physical Exam  Constitutional: She is oriented to person, place, and time. She appears well-developed and well-nourished. No distress.  HENT:  Head: Normocephalic and atraumatic.  Mouth/Throat: No oropharyngeal exudate.  Eyes: Pupils are equal, round, and reactive to light.  Neck: Normal range of motion. Neck supple.  Cardiovascular: Normal rate, regular rhythm and normal heart sounds.  Exam reveals no gallop and no friction rub.   No murmur heard. Pulmonary/Chest: Effort normal and breath sounds normal. No respiratory distress. She has no wheezes. She has no rales.  Abdominal: Soft. Bowel sounds are normal. She exhibits no distension and no mass. There is tenderness ("pressure" with palpation). There is no rebound and no guarding.  Musculoskeletal: Normal range of motion. She exhibits no edema or tenderness.  Neurological: She is alert and oriented to person, place, and time.  Skin: Skin is warm and dry.  Psychiatric: She has a normal mood and affect.    ED Course  Procedures (including critical care time) Labs Review Labs Reviewed  CBC - Abnormal; Notable for the following:    WBC 10.7 (*)    Hemoglobin 17.0 (*)    HCT 48.9 (*)    MCH 34.1 (*)    All other components within normal limits  BASIC METABOLIC PANEL - Abnormal; Notable for the following:    Glucose, Bld 120 (*)    GFR calc non Af Amer 64 (*)    GFR calc Af Amer 74 (*)    All other components within normal limits  HEPATIC FUNCTION PANEL - Abnormal; Notable for the following:    Total Protein 8.4 (*)    All other components within normal limits  TROPONIN I  LIPASE, BLOOD  I-STAT TROPOININ, ED    Imaging Review Dg Chest Portable 1 View  07/16/2014   CLINICAL DATA:  Mid chest pain. Shortness of breath. Duration: Today. Nausea.  EXAM:  PORTABLE CHEST - 1 VIEW  COMPARISON:  07/29/2012 CT abdomen  FINDINGS: Right axillary clips. The patient is rotated to the right on today's radiograph, reducing diagnostic sensitivity and specificity.  The lungs appear clear. Cardiac and mediastinal margins appear normal. No pleural effusion identified.  IMPRESSION: 1.  No significant abnormality identified.   Electronically Signed   By: Van Clines M.D.   On: 07/16/2014 14:49     EKG Interpretation   Date/Time:  Friday July 16 2014 14:14:34 EDT Ventricular Rate:  96 PR Interval:  157 QRS Duration: 78 QT Interval:  313 QTC Calculation: 395 R Axis:   69 Text Interpretation:  Sinus rhythm t wave flattening in AVL ECG OTHERWISE  WITHIN NORMAL LIMITS No previous ECGs available Confirmed by Christy Gentles  MD,  [place] ([id number]) on 07/16/2014 2:20:43 PM      MDM   Final diagnoses:  Gastroesophageal reflux disease with esophagitis  Atypical chest pain    Pt is  a 64 y.o. female with Pmhx as above who presents with episodic lower abdominal pain with radiation up to chest, with assoc n/v, and episode of diaphoresis at lunch today. No hx of similar, though has had reflux in the past. On PE, VSS, pt in NAD. She reports pressure with palpation of abdomen. EKG with isolated T wave  Flattening AVL, first trop negative.   11:54 PM Pt not having chest pain, she is having belching and some lower abdominal discomfort. 2nd trop ordered.   Repeat trop negative done 5.5 hrs after most recent episode onset, Chest feeling improved, stating she feels like she has gas and needs to have a BM. I do no feel symptoms are consistent with ACS.  She has no risk factors for PE.  Symptoms are more likely GI in nature.  We'll have her start a daily aspirin and have her follow-up with her PCP for scheduling of outpatient stress test.  [facility] evaluation in the Emergency Department is complete. It has been determined that no acute conditions requiring further  emergency intervention are present at this time. The patient/guardian have been advised of the diagnosis and plan. We have discussed signs and symptoms that warrant return to the ED, such as changes or worsening in symptoms, worsening pain, fever, shortness of breath, leg pain or swelling.      Ernestina Patches, MD 07/16/14 [phone]

## 2014-07-16 NOTE — ED Notes (Signed)
Pt called out, sts to RN "I feel like something is coming up from my stomach into my chest and making me really uncomfortable. Like I was before I got here earlier.' MD made aware.

## 2014-07-16 NOTE — ED Notes (Signed)
Pt states she didn't sleep well last night. Today, approx 1 hour ago, started having chest tightness with shortness of breath

## 2014-09-09 ENCOUNTER — Encounter: Payer: Self-pay | Admitting: Internal Medicine

## 2015-03-28 ENCOUNTER — Inpatient Hospital Stay (HOSPITAL_BASED_OUTPATIENT_CLINIC_OR_DEPARTMENT_OTHER)
Admission: EM | Admit: 2015-03-28 | Discharge: 2015-04-02 | DRG: 392 | Disposition: A | Discharge status: Home / Self Care | Payer: 59 | Attending: Internal Medicine | Admitting: Internal Medicine

## 2015-03-28 ENCOUNTER — Encounter (HOSPITAL_BASED_OUTPATIENT_CLINIC_OR_DEPARTMENT_OTHER): Payer: Self-pay | Admitting: *Deleted

## 2015-03-28 ENCOUNTER — Emergency Department (HOSPITAL_BASED_OUTPATIENT_CLINIC_OR_DEPARTMENT_OTHER): Payer: 59

## 2015-03-28 DIAGNOSIS — E876 Hypokalemia: Secondary | ICD-10-CM | POA: Diagnosis present

## 2015-03-28 DIAGNOSIS — M1990 Unspecified osteoarthritis, unspecified site: Secondary | ICD-10-CM | POA: Diagnosis present

## 2015-03-28 DIAGNOSIS — K5720 Diverticulitis of large intestine with perforation and abscess without bleeding: Secondary | ICD-10-CM | POA: Diagnosis not present

## 2015-03-28 DIAGNOSIS — Z79899 Other long term (current) drug therapy: Secondary | ICD-10-CM

## 2015-03-28 DIAGNOSIS — Z90710 Acquired absence of both cervix and uterus: Secondary | ICD-10-CM

## 2015-03-28 DIAGNOSIS — Z800 Family history of malignant neoplasm of digestive organs: Secondary | ICD-10-CM

## 2015-03-28 DIAGNOSIS — K5780 Diverticulitis of intestine, part unspecified, with perforation and abscess without bleeding: Secondary | ICD-10-CM | POA: Diagnosis not present

## 2015-03-28 DIAGNOSIS — D72829 Elevated white blood cell count, unspecified: Secondary | ICD-10-CM | POA: Diagnosis present

## 2015-03-28 DIAGNOSIS — K668 Other specified disorders of peritoneum: Secondary | ICD-10-CM | POA: Diagnosis present

## 2015-03-28 DIAGNOSIS — K219 Gastro-esophageal reflux disease without esophagitis: Secondary | ICD-10-CM | POA: Diagnosis present

## 2015-03-28 DIAGNOSIS — Z853 Personal history of malignant neoplasm of breast: Secondary | ICD-10-CM

## 2015-03-28 LAB — CBC
HCT: 41.8 % (ref 36.0–46.0)
Hemoglobin: 14.1 g/dL (ref 12.0–15.0)
MCH: 32.6 pg (ref 26.0–34.0)
MCHC: 33.7 g/dL (ref 30.0–36.0)
MCV: 96.5 fL (ref 78.0–100.0)
Platelets: 277 10*3/uL (ref 150–400)
RBC: 4.33 MIL/uL (ref 3.87–5.11)
RDW: 12.5 % (ref 11.5–15.5)
WBC: 17.2 10*3/uL — ABNORMAL HIGH (ref 4.0–10.5)

## 2015-03-28 LAB — LIPASE, BLOOD: LIPASE: 18 U/L (ref 11–51)

## 2015-03-28 LAB — COMPREHENSIVE METABOLIC PANEL
ALK PHOS: 66 U/L (ref 38–126)
ALT: 24 U/L (ref 14–54)
ANION GAP: 7 (ref 5–15)
AST: 27 U/L (ref 15–41)
Albumin: 3.8 g/dL (ref 3.5–5.0)
BUN: 13 mg/dL (ref 6–20)
CALCIUM: 9.1 mg/dL (ref 8.9–10.3)
CHLORIDE: 107 mmol/L (ref 101–111)
CO2: 25 mmol/L (ref 22–32)
Creatinine, Ser: 0.74 mg/dL (ref 0.44–1.00)
GFR calc non Af Amer: >60 mL/min (ref >60)
Glucose, Bld: 121 mg/dL — ABNORMAL HIGH (ref 65–99)
Potassium: 3.6 mmol/L (ref 3.5–5.1)
SODIUM: 139 mmol/L (ref 135–145)
Total Bilirubin: 1.1 mg/dL (ref 0.3–1.2)
Total Protein: 7.5 g/dL (ref 6.5–8.1)

## 2015-03-28 LAB — URINALYSIS, ROUTINE W REFLEX MICROSCOPIC
Bilirubin Urine: NEGATIVE
Glucose, UA: NEGATIVE mg/dL
Hgb urine dipstick: NEGATIVE
Ketones, ur: NEGATIVE mg/dL
Nitrite: NEGATIVE
PROTEIN: NEGATIVE mg/dL
Specific Gravity, Urine: 1.012 (ref 1.005–1.030)
pH: 7 (ref 5.0–8.0)

## 2015-03-28 LAB — URINE MICROSCOPIC-ADD ON: RBC / HPF: NONE SEEN RBC/hpf (ref 0–5)

## 2015-03-28 MED ORDER — MORPHINE SULFATE (PF) 4 MG/ML IV SOLN
4.0000 mg | Freq: Once | INTRAVENOUS | Status: AC
  Administered 2015-03-29: 4 mg via INTRAVENOUS
  Filled 2015-03-28: qty 1

## 2015-03-28 MED ORDER — IOHEXOL 300 MG/ML  SOLN
50.0000 mL | Freq: Once | INTRAMUSCULAR | Status: AC | PRN
  Administered 2015-03-28: 50 mL via ORAL

## 2015-03-28 MED ORDER — IOHEXOL 300 MG/ML  SOLN
100.0000 mL | Freq: Once | INTRAMUSCULAR | Status: AC | PRN
  Administered 2015-03-28: 100 mL via INTRAVENOUS

## 2015-03-28 NOTE — ED Notes (Signed)
Patient transported to CT 

## 2015-03-28 NOTE — ED Notes (Signed)
MD at bedside. 

## 2015-03-28 NOTE — ED Provider Notes (Signed)
CSN: [id number]     Arrival date & time 03/28/15  2006 History  By signing my name below, I, Stephania Fragmin, attest that this documentation has been prepared under the direction and in the presence of Sharlett Iles, MD. Electronically Signed: Stephania Fragmin, ED Scribe. 03/28/2015. 12:54 AM.   Chief Complaint  Patient presents with  . Abdominal Pain   The history is provided by the patient. No language interpreter was used.    HPI Comments: Shannon [name] is a 64 y.o. female with a history of breast cancer and diverticulosis, who presents to the Emergency Department complaining of sore lower abdominal pain that began at 5 AM this morning. She also notes associated mild diarrhea and pressure with urinating. She denies any cough but states she does bring up green-brown phlegm in the morning that was common when she used to smoke. This is a new problem. Patient was seen at [facility] and instructed to come here. She notes a history of diverticulosis but no hx diverticulitis. Patient reports a history of hysterectomy and cholecystectomy but denies a history of appendectomy. She denies blood in her stool, dysuria, hematuria, cough, sore throat, or congestion.    Past Medical History  Diagnosis Date  . Cancer [facility])     breast   Past Surgical History  Procedure Laterality Date  . Abdominal hysterectomy    . Tubal ligation    . Cholecystectomy     No family history on file. Social History  Substance Use Topics  . Smoking status: Never Smoker   . Smokeless tobacco: None  . Alcohol Use: No   OB History    No data available     Review of Systems  Gastrointestinal: Positive for abdominal pain.   A complete 10 system review of systems was obtained and all systems are negative except as noted in the HPI and PMH.    Allergies  Codeine  Home Medications   Prior to Admission medications   Medication Sig Start Date End Date Taking? Authorizing Provider  aspirin 81 MG chewable  tablet Chew 1 tablet (81 mg total) by mouth daily. 07/16/14   Ernestina Patches, MD  CALCIUM PO Take 1 tablet by mouth daily.    Historical Provider, MD  cetirizine (ZYRTEC) 10 MG tablet Take 10 mg by mouth daily as needed for allergies.    Historical Provider, MD  esomeprazole (NEXIUM) 40 MG capsule Take 1 capsule (40 mg total) by mouth daily before breakfast. Patient not taking: Reported on 07/16/2014 09/18/11   Lafayette Dragon, MD  meloxicam (MOBIC) 15 MG tablet Take 15 mg by mouth daily.    Historical Provider, MD  Multiple Vitamin (MULTIVITAMIN WITH MINERALS) TABS tablet Take 1 tablet by mouth daily.    Historical Provider, MD  Omega-3 Fatty Acids (FISH OIL PO) Take 1 tablet by mouth daily.    Historical Provider, MD  omeprazole (PRILOSEC) 40 MG capsule Take 40 mg by mouth at bedtime.    Historical Provider, MD  ondansetron (ZOFRAN) 4 MG tablet Take 1 tablet (4 mg total) by mouth every 6 (six) hours. 07/16/14   Ernestina Patches, MD  phentermine (ADIPEX-P) 37.5 MG tablet Take 37.5 mg by mouth daily before breakfast.    Historical Provider, MD  Polyethyl Glycol-Propyl Glycol (SYSTANE OP) Apply 1-2 drops to eye daily as needed (dry eyes).    Historical Provider, MD   BP 93/61 mmHg  Pulse 87  Temp(Src) 99.9 F (37.7 C) (Oral)  Resp 18  Ht 5\' 2"  (1.575 m)  Wt 172 lb (78.019 kg)  BMI 31.45 kg/m2  SpO2 95% Physical Exam  Constitutional: She is oriented to person, place, and time. She appears well-developed and well-nourished.  uncomfortable  HENT:  Head: Normocephalic and atraumatic.  Moist mucous membranes  Eyes: Conjunctivae are normal. Pupils are equal, round, and reactive to light.  Neck: Neck supple.  Cardiovascular: Normal rate, regular rhythm and normal heart sounds.   No murmur heard. Pulmonary/Chest: Effort normal and breath sounds normal.  Abdominal: Soft. Bowel sounds are normal. She exhibits no distension. There is tenderness. There is no rebound and no guarding.  Generalized TTP  without peritonitis, worst in periumbilical abdomen.   Musculoskeletal: She exhibits no edema.  Neurological: She is alert and oriented to person, place, and time.  Fluent speech  Skin: Skin is warm and dry.  Psychiatric: She has a normal mood and affect. Judgment normal.  Nursing note and vitals reviewed.   ED Course  Procedures (including critical care time)  DIAGNOSTIC STUDIES: Oxygen Saturation is 99% on RA, normal by my interpretation.    COORDINATION OF CARE: 9:52 PM - Pt made aware of elevated WBC. Suspicion for diverticulitis. However, will perform CT abdomen to r/o any other possible sources of infection. Discussed treatment plan with pt at bedside which includes . Pt verbalized understanding and agreed to plan.   Labs Review Labs Reviewed  COMPREHENSIVE METABOLIC PANEL - Abnormal; Notable for the following:    Glucose, Bld 121 (*)    All other components within normal limits  CBC - Abnormal; Notable for the following:    WBC 17.2 (*)    All other components within normal limits  URINALYSIS, ROUTINE W REFLEX MICROSCOPIC (NOT AT [facility]) - Abnormal; Notable for the following:    Leukocytes, UA TRACE (*)    All other components within normal limits  URINE MICROSCOPIC-ADD ON - Abnormal; Notable for the following:    Squamous Epithelial / LPF 0-5 (*)    Bacteria, UA FEW (*)    All other components within normal limits  LIPASE, BLOOD    Imaging Review Ct Abdomen Pelvis W Contrast  03/29/2015  CLINICAL DATA:  Lower abdominal pain, bilateral, for 19 hours. EXAM: CT ABDOMEN AND PELVIS WITH CONTRAST TECHNIQUE: Multidetector CT imaging of the abdomen and pelvis was performed using the standard protocol following bolus administration of intravenous contrast. CONTRAST:  112mL OMNIPAQUE IOHEXOL 300 MG/ML SOLN, 52mL OMNIPAQUE IOHEXOL 300 MG/ML SOLN COMPARISON:  07/29/2012 FINDINGS: Lower chest and abdominal wall: Partly visualized breast reconstruction. Hepatobiliary: Innumerable  low-density foci throughout the liver, stable and likely reflecting cysts or biliary hamartomas.Cholecystectomy. Pancreas: Unremarkable. Spleen: Unremarkable. Adrenals/Urinary Tract: Negative adrenals. No hydronephrosis or stone. 1 cm upper pole cyst on the left. Unremarkable bladder. Reproductive:Hysterectomy with negative adnexa. Stomach/Bowel: Few scattered bubbles of pneumoperitoneum in the low abdomen and pelvis. This is favored secondary to perforated colonic diverticulum as there is subtle inflammatory change around a sigmoid diverticulum. No primary small bowel inflammation or other focal abnormality is identified. Oral contrast reaches the transverse colon with no extravasation. No abscess or ascites. Negative appendix. Vascular/Lymphatic: No acute vascular abnormality. No mass or adenopathy. Musculoskeletal: Multilevel facet and disc degeneration. These results were called by telephone at the time of interpretation on 03/29/2015 at 12:19 am to Dr. Theotis Burrow , who verbally acknowledged these results. IMPRESSION: Small pneumoperitoneum, likely perforated sigmoid diverticulitis. Electronically Signed   By: Monte Fantasia M.D.   On: 03/29/2015 00:24   I  have personally reviewed and evaluated these lab results as part of my medical decision-making.   EKG Interpretation None     Medications  metroNIDAZOLE (FLAGYL) IVPB 500 mg (not administered)  ciprofloxacin (CIPRO) IVPB 400 mg (400 mg Intravenous New Bag/Given 03/29/15 0043)  morphine 4 MG/ML injection 4 mg (4 mg Intravenous Given 03/29/15 0024)  iohexol (OMNIPAQUE) 300 MG/ML solution 50 mL (50 mLs Oral Contrast Given 03/28/15 2353)  iohexol (OMNIPAQUE) 300 MG/ML solution 100 mL (100 mLs Intravenous Contrast Given 03/28/15 2351)  sodium chloride 0.9 % bolus 1,000 mL (1,000 mLs Intravenous New Bag/Given 03/29/15 0042)  fentaNYL (SUBLIMAZE) injection 50 mcg (50 mcg Intravenous Given 03/29/15 0043)    MDM   Final diagnoses:   Diverticulitis of intestine with perforation without bleeding   Patient presents with lower abdominal pain; she was seen by her PCP and sent here for further evaluation. On exam, patient was afebrile with stable vital signs. She had generalized abdominal tenderness, worst in the perineum umbilical abdomen, without peritonitis. Obtained above labs which were notable for WBC 17,000. Because of the patient's tenderness on exam and leukocytosis, obtained a CT of the abdomen and pelvis which showed diverticulitis with areas of microperforation, pneumoperitoneum. No abscess. Gave the patient IV ciprofloxacin and Flagyl and later morphine for pain. After the morphine, she immediately became hypotensive. On exam, she was mentating appropriately. Gave her an IV fluid bolus. She has been normotensive until narcotics therefore I suspect side effect from morphine rather than worsening infection. I discussed presentation with general surgery, Dr. Harlow Asa, who will review the scans and consult on patient. Discussed with [facility] hospitalist, Dr.Opyd, who will admit the patient to [facility]. Patient transferred in stable condition for further care.  I personally performed the services described in this documentation, which was scribed in my presence. The recorded information has been reviewed and is accurate.    Sharlett Iles, MD 03/29/15 [phone]

## 2015-03-28 NOTE — ED Notes (Addendum)
Lower abdominal pain since this. She was seen at [place] and told to come here. She has the results of her urine test.

## 2015-03-29 ENCOUNTER — Encounter (HOSPITAL_COMMUNITY): Payer: Self-pay | Admitting: Family Medicine

## 2015-03-29 DIAGNOSIS — Z90710 Acquired absence of both cervix and uterus: Secondary | ICD-10-CM | POA: Diagnosis not present

## 2015-03-29 DIAGNOSIS — M1990 Unspecified osteoarthritis, unspecified site: Secondary | ICD-10-CM | POA: Diagnosis present

## 2015-03-29 DIAGNOSIS — K5780 Diverticulitis of intestine, part unspecified, with perforation and abscess without bleeding: Secondary | ICD-10-CM

## 2015-03-29 DIAGNOSIS — Z800 Family history of malignant neoplasm of digestive organs: Secondary | ICD-10-CM | POA: Diagnosis not present

## 2015-03-29 DIAGNOSIS — D72829 Elevated white blood cell count, unspecified: Secondary | ICD-10-CM | POA: Diagnosis present

## 2015-03-29 DIAGNOSIS — K219 Gastro-esophageal reflux disease without esophagitis: Secondary | ICD-10-CM | POA: Diagnosis present

## 2015-03-29 DIAGNOSIS — Z853 Personal history of malignant neoplasm of breast: Secondary | ICD-10-CM | POA: Diagnosis not present

## 2015-03-29 DIAGNOSIS — E876 Hypokalemia: Secondary | ICD-10-CM | POA: Diagnosis present

## 2015-03-29 DIAGNOSIS — K5720 Diverticulitis of large intestine with perforation and abscess without bleeding: Secondary | ICD-10-CM | POA: Diagnosis present

## 2015-03-29 DIAGNOSIS — K668 Other specified disorders of peritoneum: Secondary | ICD-10-CM | POA: Diagnosis present

## 2015-03-29 DIAGNOSIS — Z79899 Other long term (current) drug therapy: Secondary | ICD-10-CM | POA: Diagnosis not present

## 2015-03-29 LAB — CBC
HCT: 41.7 % (ref 36.0–46.0)
Hemoglobin: 14.2 g/dL (ref 12.0–15.0)
MCH: 33.6 pg (ref 26.0–34.0)
MCHC: 34.1 g/dL (ref 30.0–36.0)
MCV: 98.8 fL (ref 78.0–100.0)
PLATELETS: 175 10*3/uL (ref 150–400)
RBC: 4.22 MIL/uL (ref 3.87–5.11)
RDW: 13.1 % (ref 11.5–15.5)
WBC: 12.4 10*3/uL — ABNORMAL HIGH (ref 4.0–10.5)

## 2015-03-29 LAB — CREATININE, SERUM
Creatinine, Ser: 0.69 mg/dL (ref 0.44–1.00)
GFR calc Af Amer: >60 mL/min (ref >60)
GFR calc non Af Amer: >60 mL/min (ref >60)

## 2015-03-29 MED ORDER — SODIUM CHLORIDE 0.9 % IV BOLUS (SEPSIS): 1000.0000 mL | Freq: Once | INTRAVENOUS | Status: DC

## 2015-03-29 MED ORDER — SODIUM CHLORIDE 0.9 % IV BOLUS (SEPSIS)
1000.0000 mL | Freq: Once | INTRAVENOUS | Status: AC
  Administered 2015-03-29: 1000 mL via INTRAVENOUS

## 2015-03-29 MED ORDER — SODIUM CHLORIDE 0.9 % IV SOLN
INTRAVENOUS | Status: DC
  Administered 2015-03-29 – 2015-03-30 (×4): via INTRAVENOUS

## 2015-03-29 MED ORDER — PROMETHAZINE HCL 25 MG/ML IJ SOLN
INTRAMUSCULAR | Status: AC
  Administered 2015-03-29: 12.5 mg via INTRAVENOUS
  Filled 2015-03-29: qty 1

## 2015-03-29 MED ORDER — FENTANYL CITRATE (PF) 100 MCG/2ML IJ SOLN
25.0000 ug | INTRAMUSCULAR | Status: DC | PRN
Start: 2015-03-29 — End: 2015-04-01
  Administered 2015-03-29 – 2015-03-30 (×6): 25 ug via INTRAVENOUS
  Filled 2015-03-29 (×6): qty 2

## 2015-03-29 MED ORDER — ENOXAPARIN SODIUM 40 MG/0.4ML ~~LOC~~ SOLN
40.0000 mg | SUBCUTANEOUS | Status: DC
  Administered 2015-03-29 – 2015-04-01 (×4): 40 mg via SUBCUTANEOUS
  Filled 2015-03-29 (×4): qty 0.4

## 2015-03-29 MED ORDER — ACETAMINOPHEN 650 MG RE SUPP: 650.0000 mg | Freq: Four times a day (QID) | RECTAL | Status: DC | PRN

## 2015-03-29 MED ORDER — NON FORMULARY: 40.0000 mg | Freq: Every day | Status: DC

## 2015-03-29 MED ORDER — PROMETHAZINE HCL 25 MG PO TABS
12.5000 mg | ORAL_TABLET | Freq: Four times a day (QID) | ORAL | Status: DC | PRN
  Filled 2015-03-29: qty 1

## 2015-03-29 MED ORDER — CIPROFLOXACIN IN D5W 400 MG/200ML IV SOLN: 500.0000 mg | Freq: Once | INTRAVENOUS | Status: DC

## 2015-03-29 MED ORDER — SODIUM CHLORIDE 0.9 % IJ SOLN
3.0000 mL | Freq: Two times a day (BID) | INTRAMUSCULAR | Status: DC
  Administered 2015-03-29 – 2015-04-02 (×3): 3 mL via INTRAVENOUS

## 2015-03-29 MED ORDER — FENTANYL CITRATE (PF) 100 MCG/2ML IJ SOLN: 50.0000 ug | Freq: Once | INTRAMUSCULAR | Status: DC

## 2015-03-29 MED ORDER — METRONIDAZOLE IN NACL 5-0.79 MG/ML-% IV SOLN
500.0000 mg | Freq: Once | INTRAVENOUS | Status: AC
  Administered 2015-03-29: 500 mg via INTRAVENOUS
  Filled 2015-03-29: qty 100

## 2015-03-29 MED ORDER — FENTANYL CITRATE (PF) 100 MCG/2ML IJ SOLN
INTRAMUSCULAR | Status: AC
  Filled 2015-03-29: qty 2

## 2015-03-29 MED ORDER — CIPROFLOXACIN IN D5W 400 MG/200ML IV SOLN
400.0000 mg | Freq: Two times a day (BID) | INTRAVENOUS | Status: DC
  Administered 2015-03-29 – 2015-04-01 (×6): 400 mg via INTRAVENOUS
  Filled 2015-03-29 (×7): qty 200

## 2015-03-29 MED ORDER — PROMETHAZINE HCL 25 MG/ML IJ SOLN
12.5000 mg | Freq: Once | INTRAMUSCULAR | Status: AC
  Administered 2015-03-29: 12.5 mg via INTRAVENOUS

## 2015-03-29 MED ORDER — FENTANYL CITRATE (PF) 100 MCG/2ML IJ SOLN
50.0000 ug | Freq: Once | INTRAMUSCULAR | Status: AC
  Administered 2015-03-29: 50 ug via INTRAVENOUS

## 2015-03-29 MED ORDER — METRONIDAZOLE IN NACL 5-0.79 MG/ML-% IV SOLN
500.0000 mg | Freq: Three times a day (TID) | INTRAVENOUS | Status: DC
  Administered 2015-03-29 – 2015-04-01 (×10): 500 mg via INTRAVENOUS
  Filled 2015-03-29 (×10): qty 100

## 2015-03-29 MED ORDER — ACETAMINOPHEN 325 MG PO TABS
650.0000 mg | ORAL_TABLET | Freq: Four times a day (QID) | ORAL | Status: DC | PRN
  Administered 2015-03-30 (×2): 650 mg via ORAL
  Filled 2015-03-29 (×2): qty 2

## 2015-03-29 MED ORDER — OMEPRAZOLE 20 MG PO CPDR
40.0000 mg | DELAYED_RELEASE_CAPSULE | Freq: Every day | ORAL | Status: DC
  Administered 2015-03-30 – 2015-04-01 (×3): 40 mg via ORAL
  Filled 2015-03-29 (×5): qty 2

## 2015-03-29 MED ORDER — CIPROFLOXACIN IN D5W 400 MG/200ML IV SOLN
400.0000 mg | INTRAVENOUS | Status: DC
  Filled 2015-03-29: qty 200

## 2015-03-29 MED ORDER — MELOXICAM 15 MG PO TABS
15.0000 mg | ORAL_TABLET | Freq: Every day | ORAL | Status: DC
  Administered 2015-03-30 – 2015-04-02 (×4): 15 mg via ORAL
  Filled 2015-03-29 (×5): qty 1

## 2015-03-29 MED ORDER — CIPROFLOXACIN IN D5W 400 MG/200ML IV SOLN
400.0000 mg | Freq: Once | INTRAVENOUS | Status: AC
  Administered 2015-03-29: 400 mg via INTRAVENOUS
  Filled 2015-03-29: qty 200

## 2015-03-29 MED ORDER — FENTANYL CITRATE (PF) 100 MCG/2ML IJ SOLN
100.0000 ug | Freq: Once | INTRAMUSCULAR | Status: AC
  Administered 2015-03-29: 100 ug via INTRAVENOUS
  Filled 2015-03-29: qty 2

## 2015-03-29 NOTE — Care Management Note (Signed)
Case Management Note  Patient Details  Name: Shannon [name] MRN: [id number] Date of Birth: 09/07/1950  Subjective/Objective:   64 y/o f admitted w/Diverticulitis. From home.[facility] following.                 Action/Plan:d/c plan home.   Expected Discharge Date:                  Expected Discharge Plan:  Home/Self Care  In-House Referral:     Discharge planning Services  CM Consult  Post Acute Care Choice:    Choice offered to:     DME Arranged:    DME Agency:     HH Arranged:    HH Agency:     Status of Service:  In process, will continue to follow  Medicare Important Message Given:    Date Medicare IM Given:    Medicare IM give by:    Date Additional Medicare IM Given:    Additional Medicare Important Message give by:     If discussed at [place] of Stay Meetings, dates discussed:    Additional Comments:  Dessa Phi, RN 03/29/2015, 4:18 PM

## 2015-03-29 NOTE — Progress Notes (Signed)
Generally-well 21 yof w/ history of breast ca and known diverticulosis, coming from [facility] with diverticulitis.  Developed R>L abd pain yesterday, saw PCP at [facility], and was referred to ED.  Afebrile and not tachycardic. CT shows some pneumoperitoneum adjacent to suspected diverticulitis. Dr. Harlow Asa of surgery consulted from ED and believes can be medically managed.  Given IVF and Cipro/Flagyl prior to transfer. Accepted to tele.

## 2015-03-29 NOTE — Progress Notes (Signed)
Patient ID: Shannon [name], female   DOB: Jun 16, 1950, 64 y.o.   MRN: [id number] Pt seen and examined at the bedside Please refer to admission note from 03/29/2015  64 y.o. female with a past medical history significant for GERD who presents to [facility] ED with abdominal pain and was found to have diverticulitis with possible perforation. She was placed on NPO. Surgery consulted, appreciate their input. Continue IV fluids for hydration while pt NPO. Continue pain management efforts.   Leisa Lenz [facility] [phone]

## 2015-03-29 NOTE — ED Notes (Signed)
Pt blood pressure dropped low after morphine and patient began to hurt worse and hyperventilating.  MD notified. Bolus ordered and fentanyl.

## 2015-03-29 NOTE — ED Notes (Signed)
Report given to [place] room [phone] and [facility]

## 2015-03-29 NOTE — H&P (Signed)
History and Physical  Patient Name: Shannon [name]     [id number]    DOB: 09-Sep-1950    DOA: 03/28/2015 Referring physician: Theotis Burrow, MD PCP: Mayra Neer, MD      Chief Complaint: Abdominal pain  HPI: Shannon [name] is a 64 y.o. female with a past medical history significant for GERD who presents with abdominal pain.  The patient was in her normal state of health until yesterday morning at 5 AM when she woke with severe right lower quadrant pain. The pain was constant, "aching " in character, and associated with tenderness with movement or pressing on her belly. She noted foul-smelling flatulence and diarrhea for the previous several days , but no fever, chills, hematochezia, emesis.   She went to her PCP today who sent her to the ER.  In the ED, the patient had a low-grade fever, tachycardia, and leukocytosis. A CT of the abdomen and pelvis with contrast showed scant pneumoperitoneum in possible diverticulitis. The case was discussed with general surgery who recommended initial medical management, and admission for serial abdominal exams. Ciprofloxacin and metronidazole were administered and the patient was admitted to [facility].     Review of Systems:  All other systems negative except as just noted or noted in the history of present illness.   Allergies:  Allergies  Allergen Reactions  . Codeine Nausea Only    Home medications: 1.  Omeprazole 40 mg daily 2. Meloxicam  Past medical history: 1.  Breast cancer, 25 years ago  2. GERD 3. osteoarthritis  Past surgical history: 1.  Right mastectomy  2. Gallbladder surgery 2008 3. Hysterectomy 2011  Family history:   father, pancreatic cancer, skin cancer. Paternal grandmother diverticulitis. Mother, dementia   Social History:  Patient lives  By herself. She formerly worked at [employer]. she does not smoke. She is independent with all IADLs and ADLs, ambulates without assistance, and still  drives.        Physical Exam: BP 113/63 mmHg  Pulse 94  Temp(Src) 97.7 F (36.5 C) (Oral)  Resp 16  Ht 5\' 2"  (1.575 m)  Wt 82 kg (180 lb 12.4 oz)  BMI 33.06 kg/m2  SpO2 89% General appearance: Well-developed, adult female, alert and in mild distress from pain.   Eyes: Anicteric, conjunctiva pink, lids and lashes normal.     ENT: No nasal deformity, discharge, or epistaxis.  OP moist without lesions.   Skin: Warm and dry.  Cardiac: Tachycardic, regular, nl S1-S2, no murmurs appreciated.  Capillary refill is brisk.  Respiratory: Normal respiratory rate and rhythm.  CTAB without rales or wheezes. Abdomen: Abdomen soft without rigidity but marked TTP, worst in RLQ, but noted everywhere.  No rebound. No ascites, distension.   MSK: No deformities or effusions. Neuro: Sensorium intact and responding to questions, attention normal.  Speech is fluent.  Moves all extremities equally and with normal coordination.    Psych: Behavior appropriate.  Affect normal.  No evidence of aural or visual hallucinations or delusions.       Labs on Admission:  The metabolic panel shows  Normal sodium, potassium, bicarbonate, and renal function.  the transaminases and bilirubin are normal. The lipase is normal.   the urinalysis is clear The complete blood count shows  leukocytosis.   Radiological Exams on Admission: Ct Abdomen Pelvis W Contrast 03/29/2015   IMPRESSION: Small pneumoperitoneum, likely perforated sigmoid diverticulitis.       Assessment/Plan 1. Diverticulitis:  This is new.     -Ciprofloxacin  400 mg IV q12hrs -Metronidazole 250 mg IV q8hrs -Serial abdominal exams -NPO, IVF and fentanyl/phenergan for pain/nausea given hypotension with morphine -Consult to general surgery   2. GERD:  Patient does not tolerate pantoprazole. -Contineu home omeprazole  3. OA:  -Continue home meloxicam      DVT PPx: Lovenox Diet: NPO Consultants: Gen Surg Code Status: Full Family  Communication: Daughter, sleeping at bedside  Medical decision making: What exists of the patient's previous chart was reviewed in depth. Patient seen 5:29 AM on 03/29/2015.  Disposition Plan:  Admit for IV antibiotics, serial abdominal exams.  Hope for non-operative management, will have consultation with General Surgery.      Edwin Dada [facility] Pager [phone]

## 2015-03-29 NOTE — Consult Note (Signed)
Shannon [name] Aug 05, 1950  [id number].   Requesting MD: Dr. Leisa Lenz Chief Complaint/Reason for Consult: diverticulitis with microperf HPI: This is a 64 yo white female with a history of breast cancer who woke up yesterday morning at 0500am with pretty severe abdominal pain.  This was diffuse in nature.  She denies nausea, vomiting, chills, or sweats.  She had some looser stools, but no frank diarrhea.  She had a low grade fever of 99.9 when she arrived here to [facility] ED.  Due to acutely worsening pain, she presented to [facility] ED for evaluation where she had a CT scan that revealed inflammation of one diverticulum c/w with diverticulitis and a few locules of free air.  She was admitted to the medicine service and transferred here to [facility].  We have been asked to see for further evaluation.  ROS : Please see HPI, otherwise negative.  Family History  Problem Relation Age of Onset  . Dementia Mother   . Skin cancer Father   . Pancreatic cancer Father   . Diverticulitis Paternal Grandmother     Past Medical History  Diagnosis Date  . Cancer [facility])     breast    Past Surgical History  Procedure Laterality Date  . Abdominal hysterectomy    . Tubal ligation    . Cholecystectomy    . Mastectomy Right     with bilateral implants    Social History:  reports that she has never smoked. She does not have any smokeless tobacco history on file. She reports that she does not drink alcohol or use illicit drugs.  Allergies:  Allergies  Allergen Reactions  . Codeine Nausea Only    Medications Prior to Admission  Medication Sig Dispense Refill  . aspirin-acetaminophen-caffeine (EXCEDRIN MIGRAINE) 250-250-65 MG tablet Take 1 tablet by mouth every 6 (six) hours as needed for headache.    Marland Kitchen CALCIUM PO Take 1 tablet by mouth daily.    . cetirizine (ZYRTEC) 10 MG tablet Take 10 mg by mouth daily as needed for allergies.    . meloxicam (MOBIC) 15 MG tablet Take 15 mg by mouth daily.    .  Multiple Vitamin (MULTIVITAMIN WITH MINERALS) TABS tablet Take 1 tablet by mouth daily.    . Omega-3 Fatty Acids (FISH OIL PO) Take 1 tablet by mouth daily.    Marland Kitchen omeprazole (PRILOSEC) 40 MG capsule Take 40 mg by mouth at bedtime.    Vladimir Faster Glycol-Propyl Glycol (SYSTANE OP) Apply 1-2 drops to eye daily as needed (dry eyes).    . [DISCONTINUED] aspirin 81 MG chewable tablet Chew 1 tablet (81 mg total) by mouth daily. (Patient not taking: Reported on 03/29/2015) 30 tablet 0  . [DISCONTINUED] esomeprazole (NEXIUM) 40 MG capsule Take 1 capsule (40 mg total) by mouth daily before breakfast. (Patient not taking: Reported on 07/16/2014) 30 capsule 5  . [DISCONTINUED] ondansetron (ZOFRAN) 4 MG tablet Take 1 tablet (4 mg total) by mouth every 6 (six) hours. (Patient not taking: Reported on 03/29/2015) 12 tablet 0  . [DISCONTINUED] phentermine (ADIPEX-P) 37.5 MG tablet Take 37.5 mg by mouth daily before breakfast.      Blood pressure 114/68, pulse 93, temperature 98.6 F (37 C), temperature source Oral, resp. rate 18, height '5\' 2"'$  (1.575 m), weight 82 kg (180 lb 12.4 oz), SpO2 99 %. Physical Exam: General: pleasant, WD, WN white female who is laying in bed in NAD HEENT: head is normocephalic, atraumatic.  Sclera are noninjected.  PERRL.  Ears and  nose without any masses or lesions.  Mouth is pink and moist Heart: regular, rate, and rhythm.  Normal s1,s2. No obvious murmurs, gallops, or rubs noted.  Palpable radial and pedal pulses bilaterally Lungs: CTAB, no wheezes, rhonchi, or rales noted.  Respiratory effort nonlabored Abd: soft, diffusely tender with some voluntary guarding.  Greatest tenderness in RLQ, but about equal everywhere, ND, +BS, no masses, hernias, or organomegaly MS: all 4 extremities are symmetrical with no cyanosis, clubbing, or edema. Skin: warm and dry with no masses, lesions, or rashes Psych: A&Ox3 with an appropriate affect.    Results for orders placed or performed during the  hospital encounter of 03/28/15 (from the past 48 hour(s))  Lipase, blood     Status: None   Collection Time: 03/28/15  9:19 PM  Result Value Ref Range   Lipase 18 11 - 51 U/L  Comprehensive metabolic panel     Status: Abnormal   Collection Time: 03/28/15  9:19 PM  Result Value Ref Range   Sodium 139 135 - 145 mmol/L   Potassium 3.6 3.5 - 5.1 mmol/L   Chloride 107 101 - 111 mmol/L   CO2 25 22 - 32 mmol/L   Glucose, Bld 121 (H) 65 - 99 mg/dL   BUN 13 6 - 20 mg/dL   Creatinine, Ser 0.74 0.44 - 1.00 mg/dL   Calcium 9.1 8.9 - 10.3 mg/dL   Total Protein 7.5 6.5 - 8.1 g/dL   Albumin 3.8 3.5 - 5.0 g/dL   AST 27 15 - 41 U/L   ALT 24 14 - 54 U/L   Alkaline Phosphatase 66 38 - 126 U/L   Total Bilirubin 1.1 0.3 - 1.2 mg/dL   GFR calc non Af Amer >60 >60 mL/min   GFR calc Af Amer >60 >60 mL/min    Comment: (NOTE) The eGFR has been calculated using the CKD EPI equation. This calculation has not been validated in all clinical situations. eGFR's persistently <60 mL/min signify possible Chronic Kidney Disease.    Anion gap 7 5 - 15  CBC     Status: Abnormal   Collection Time: 03/28/15  9:19 PM  Result Value Ref Range   WBC 17.2 (H) 4.0 - 10.5 K/uL   RBC 4.33 3.87 - 5.11 MIL/uL   Hemoglobin 14.1 12.0 - 15.0 g/dL   HCT 41.8 36.0 - 46.0 %   MCV 96.5 78.0 - 100.0 fL   MCH 32.6 26.0 - 34.0 pg   MCHC 33.7 30.0 - 36.0 g/dL   RDW 12.5 11.5 - 15.5 %   Platelets 277 150 - 400 K/uL  Urinalysis, Routine w reflex microscopic     Status: Abnormal   Collection Time: 03/28/15  9:56 PM  Result Value Ref Range   Color, Urine YELLOW YELLOW   APPearance CLEAR CLEAR   Specific Gravity, Urine 1.012 1.005 - 1.030   pH 7.0 5.0 - 8.0   Glucose, UA NEGATIVE NEGATIVE mg/dL   Hgb urine dipstick NEGATIVE NEGATIVE   Bilirubin Urine NEGATIVE NEGATIVE   Ketones, ur NEGATIVE NEGATIVE mg/dL   Protein, ur NEGATIVE NEGATIVE mg/dL   Nitrite NEGATIVE NEGATIVE   Leukocytes, UA TRACE (A) NEGATIVE  Urine  microscopic-add on     Status: Abnormal   Collection Time: 03/28/15  9:56 PM  Result Value Ref Range   Squamous Epithelial / LPF 0-5 (A) NONE SEEN   WBC, UA 0-5 0 - 5 WBC/hpf   RBC / HPF NONE SEEN 0 - 5 RBC/hpf  Bacteria, UA FEW (A) NONE SEEN  CBC     Status: Abnormal   Collection Time: 03/29/15  6:56 AM  Result Value Ref Range   WBC 12.4 (H) 4.0 - 10.5 K/uL   RBC 4.22 3.87 - 5.11 MIL/uL   Hemoglobin 14.2 12.0 - 15.0 g/dL   HCT 41.7 36.0 - 46.0 %   MCV 98.8 78.0 - 100.0 fL   MCH 33.6 26.0 - 34.0 pg   MCHC 34.1 30.0 - 36.0 g/dL   RDW 13.1 11.5 - 15.5 %   Platelets 175 150 - 400 K/uL  Creatinine, serum     Status: None   Collection Time: 03/29/15  6:56 AM  Result Value Ref Range   Creatinine, Ser 0.69 0.44 - 1.00 mg/dL   GFR calc non Af Amer >60 >60 mL/min   GFR calc Af Amer >60 >60 mL/min    Comment: (NOTE) The eGFR has been calculated using the CKD EPI equation. This calculation has not been validated in all clinical situations. eGFR's persistently <60 mL/min signify possible Chronic Kidney Disease.    Ct Abdomen Pelvis W Contrast  03/29/2015  CLINICAL DATA:  Lower abdominal pain, bilateral, for 19 hours. EXAM: CT ABDOMEN AND PELVIS WITH CONTRAST TECHNIQUE: Multidetector CT imaging of the abdomen and pelvis was performed using the standard protocol following bolus administration of intravenous contrast. CONTRAST:  17m OMNIPAQUE IOHEXOL 300 MG/ML SOLN, 560mOMNIPAQUE IOHEXOL 300 MG/ML SOLN COMPARISON:  07/29/2012 FINDINGS: Lower chest and abdominal wall: Partly visualized breast reconstruction. Hepatobiliary: Innumerable low-density foci throughout the liver, stable and likely reflecting cysts or biliary hamartomas.Cholecystectomy. Pancreas: Unremarkable. Spleen: Unremarkable. Adrenals/Urinary Tract: Negative adrenals. No hydronephrosis or stone. 1 cm upper pole cyst on the left. Unremarkable bladder. Reproductive:Hysterectomy with negative adnexa. Stomach/Bowel: Few scattered  bubbles of pneumoperitoneum in the low abdomen and pelvis. This is favored secondary to perforated colonic diverticulum as there is subtle inflammatory change around a sigmoid diverticulum. No primary small bowel inflammation or other focal abnormality is identified. Oral contrast reaches the transverse colon with no extravasation. No abscess or ascites. Negative appendix. Vascular/Lymphatic: No acute vascular abnormality. No mass or adenopathy. Musculoskeletal: Multilevel facet and disc degeneration. These results were called by telephone at the time of interpretation on 03/29/2015 at 12:19 am to Dr. RATheotis Burrow who verbally acknowledged these results. IMPRESSION: Small pneumoperitoneum, likely perforated sigmoid diverticulitis. Electronically Signed   By: JoMonte Fantasia.D.   On: 03/29/2015 00:24       Assessment/Plan 1. Diverticulitis with microperforation -the patient is very tender today.  Agree with bowel rest and IV abx therapy.  She is currently on C/F.  Her WBC has decreased from 17 to 12; however, if her pain does not improve quite a bit by tomorrow, we will consider transitioning her to zosyn. -cont NPO x a few ice chips.  She may need bowel rest for a several days given the level of her pain right now. -cont conservative management.  If she fails to improve or acutely worsens, she may require surgical intervention, but will try to avoid this is possible -she has had a colonoscopy before by Dr. BrOlevia Perchesbut suspects its been a while and that she should be due pretty soon for her routine screening. -we will follow the patient with you.  Nalah Macioce E 03/29/2015, 1:31 PM Pager: [phone]

## 2015-03-29 NOTE — ED Notes (Signed)
Attempted to call report to [facility] floor [place] RN unable to take report at this time.

## 2015-03-29 NOTE — ED Notes (Signed)
Pt now resting comfortably.  Pain 0/10.  No distress noted at this time.  VSS

## 2015-03-29 NOTE — Progress Notes (Signed)
Initial Nutrition Assessment  DOCUMENTATION CODES:   Obesity unspecified  INTERVENTION:  - Diet advancement as medically feasible - RD will continue to monitor for needs  NUTRITION DIAGNOSIS:   Inadequate oral intake related to inability to eat as evidenced by NPO status.   GOAL:   Patient will meet greater than or equal to 90% of their needs  MONITOR:   Diet advancement, Weight trends, Labs, I & O's  REASON FOR ASSESSMENT:   Malnutrition Screening Tool    ASSESSMENT:   64 y.o. female with a past medical history significant for GERD who presents with abdominal pain. The patient was in her normal state of health until yesterday morning at 5 AM when she woke with severe right lower quadrant pain. The pain was constant, "aching " in character, and associated with tenderness with movement or pressing on her belly. She noted foul-smelling flatulence and diarrhea for the previous several days , but no fever, chills, hematochezia, emesis. She went to her PCP today who sent her to the ER.  Pt seen for MST. BMI indicates obesity. Pt reports fair appetite recently. She had been on a supervised weight loss program through an MD in [facility] and was also taking a medication in combination with this; unsure of medication at this time. She states that she stopped taking this medication recently due to it causing dry mouth. She states that she takes her son out to eat once/week but that she often eats items such as salad, yogurt, and other light foods.   Pt reports that abdominal pain began yesterday AM around 0500. Between that time and admission she consumed a can of Coke and ate saltine crackers and a granola bar; she does not feel that these items exacerbated abdominal pain. She states one episode of beginning to feel nauseated but she never had emesis. Pt reports that MD dx her with diverticulosis years ago and encouraged her to avoid items with seeds and kernels related to this. Pt reports  that other than this flair she has had only one other issue since dx. She states that in April or May 2016 she felt nauseated while out to eat and used the restroom to vomit. She then felt better for a short period but nauseous feeling returned for several cycles and she was brought to the ED at that time.  No recent weight hx available. Pt reports intentional weight loss PTA. No muscle or fat wasting noted. She is unable to meet needs at this time. Medications reviewed. Labs reviewed.   Diet Order:  Diet NPO time specified Except for: Sips with Meds, Ice Chips  Skin:  Reviewed, no issues  Last BM:  12/19  Height:   Ht Readings from Last 1 Encounters:  03/29/15 5\' 2"  (1.575 m)    Weight:   Wt Readings from Last 1 Encounters:  03/29/15 180 lb 12.4 oz (82 kg)    Ideal Body Weight:  50 kg (kg)  BMI:  Body mass index is 33.06 kg/(m^2).  Estimated Nutritional Needs:   Kcal:  1475-1650 (18-20 kcal/kg)  Protein:  60-70 grams  Fluid:  2-2.2 L/day  EDUCATION NEEDS:   No education needs identified at this time     Jarome Matin, RD, LDN Inpatient Clinical Dietitian Pager # [phone] After hours/weekend pager # [phone]

## 2015-03-30 ENCOUNTER — Encounter (HOSPITAL_COMMUNITY): Payer: Self-pay | Admitting: Internal Medicine

## 2015-03-30 DIAGNOSIS — D72829 Elevated white blood cell count, unspecified: Secondary | ICD-10-CM | POA: Diagnosis present

## 2015-03-30 DIAGNOSIS — K219 Gastro-esophageal reflux disease without esophagitis: Secondary | ICD-10-CM

## 2015-03-30 DIAGNOSIS — E876 Hypokalemia: Secondary | ICD-10-CM | POA: Diagnosis present

## 2015-03-30 LAB — CBC
HCT: 39.5 % (ref 36.0–46.0)
Hemoglobin: 12.9 g/dL (ref 12.0–15.0)
MCH: 32.8 pg (ref 26.0–34.0)
MCHC: 32.7 g/dL (ref 30.0–36.0)
MCV: 100.5 fL — ABNORMAL HIGH (ref 78.0–100.0)
PLATELETS: 219 10*3/uL (ref 150–400)
RBC: 3.93 MIL/uL (ref 3.87–5.11)
RDW: 13.2 % (ref 11.5–15.5)
WBC: 13.5 10*3/uL — ABNORMAL HIGH (ref 4.0–10.5)

## 2015-03-30 LAB — BASIC METABOLIC PANEL
Anion gap: 10 (ref 5–15)
BUN: 10 mg/dL (ref 6–20)
CALCIUM: 8.4 mg/dL — AB (ref 8.9–10.3)
CO2: 22 mmol/L (ref 22–32)
CREATININE: 0.75 mg/dL (ref 0.44–1.00)
Chloride: 112 mmol/L — ABNORMAL HIGH (ref 101–111)
GFR calc non Af Amer: >60 mL/min (ref >60)
GLUCOSE: 92 mg/dL (ref 65–99)
Potassium: 3.2 mmol/L — ABNORMAL LOW (ref 3.5–5.1)
Sodium: 144 mmol/L (ref 135–145)

## 2015-03-30 MED ORDER — SODIUM CHLORIDE 0.9 % IV SOLN
INTRAVENOUS | Status: DC
  Administered 2015-03-30: 14:00:00 via INTRAVENOUS
  Filled 2015-03-30 (×3): qty 1000

## 2015-03-30 MED ORDER — ASPIRIN-ACETAMINOPHEN-CAFFEINE 250-250-65 MG PO TABS
1.0000 | ORAL_TABLET | Freq: Four times a day (QID) | ORAL | Status: DC | PRN
  Administered 2015-03-30 – 2015-04-01 (×3): 1 via ORAL
  Filled 2015-03-30 (×4): qty 1

## 2015-03-30 NOTE — Progress Notes (Signed)
Subjective: Feeling better.   Objective: Vital signs in last 24 hours: Temp:  [99 F (37.2 C)-99.8 F (37.7 C)] 99 F (37.2 C) (12/21 0643) Pulse Rate:  [85-109] 85 (12/21 0643) Resp:  [18] 18 (12/21 0643) BP: (110-119)/(61-69) 110/64 mmHg (12/21 0643) SpO2:  [95 %-97 %] 97 % (12/21 0643) Last BM Date: 03/30/15  Intake/Output from previous day: 12/20 0701 - 12/21 0700 In: 1900 [I.V.:1500; IV Piggyback:400] Out: -  Intake/Output this shift:    Resp: clear to auscultation bilaterally Cardio: regular rate and rhythm GI: softer and less tender today  Lab Results:   Recent Labs  03/29/15 0656 03/30/15 0534  WBC 12.4* 13.5*  HGB 14.2 12.9  HCT 41.7 39.5  PLT 175 219   BMET  Recent Labs  03/28/15 2119 03/29/15 0656 03/30/15 0534  NA 139  --  144  K 3.6  --  3.2*  CL 107  --  112*  CO2 25  --  22  GLUCOSE 121*  --  92  BUN 13  --  10  CREATININE 0.74 0.69 0.75  CALCIUM 9.1  --  8.4*   PT/INR No results for input(Shannon [name]): LABPROT, INR in the last 72 hours. ABG No results for input(Shannon [name]): PHART, HCO3 in the last 72 hours.  Invalid input(Shannon [name]): PCO2, PO2  Studies/Results: Ct Abdomen Pelvis W Contrast  03/29/2015  CLINICAL DATA:  Lower abdominal pain, bilateral, for 19 hours. EXAM: CT ABDOMEN AND PELVIS WITH CONTRAST TECHNIQUE: Multidetector CT imaging of the abdomen and pelvis was performed using the standard protocol following bolus administration of intravenous contrast. CONTRAST:  169mL OMNIPAQUE IOHEXOL 300 MG/ML SOLN, 49mL OMNIPAQUE IOHEXOL 300 MG/ML SOLN COMPARISON:  07/29/2012 FINDINGS: Lower chest and abdominal wall: Partly visualized breast reconstruction. Hepatobiliary: Innumerable low-density foci throughout the liver, stable and likely reflecting cysts or biliary hamartomas.Cholecystectomy. Pancreas: Unremarkable. Spleen: Unremarkable. Adrenals/Urinary Tract: Negative adrenals. No hydronephrosis or stone. 1 cm upper pole cyst on the left. Unremarkable bladder.  Reproductive:Hysterectomy with negative adnexa. Stomach/Bowel: Few scattered bubbles of pneumoperitoneum in the low abdomen and pelvis. This is favored secondary to perforated colonic diverticulum as there is subtle inflammatory change around a sigmoid diverticulum. No primary small bowel inflammation or other focal abnormality is identified. Oral contrast reaches the transverse colon with no extravasation. No abscess or ascites. Negative appendix. Vascular/Lymphatic: No acute vascular abnormality. No mass or adenopathy. Musculoskeletal: Multilevel facet and disc degeneration. These results were called by telephone at the time of interpretation on 03/29/2015 at 12:19 am to Dr. Theotis Burrow , who verbally acknowledged these results. IMPRESSION: Small pneumoperitoneum, likely perforated sigmoid diverticulitis. Electronically Signed   By: Monte Fantasia M.D.   On: 03/29/2015 00:24    Anti-infectives: Anti-infectives    Start     Dose/Rate Route Frequency Ordered Stop   03/29/15 1230  ciprofloxacin (CIPRO) IVPB 400 mg     400 mg 200 mL/hr over 60 Minutes Intravenous Every 12 hours 03/29/15 0612     03/29/15 1000  ciprofloxacin (CIPRO) IVPB 400 mg  Status:  Discontinued     400 mg 200 mL/hr over 60 Minutes Intravenous Every 24 hours 03/29/15 0521 03/29/15 0612   03/29/15 1000  metroNIDAZOLE (FLAGYL) IVPB 500 mg     500 mg 100 mL/hr over 60 Minutes Intravenous Every 8 hours 03/29/15 0521     03/29/15 0030  metroNIDAZOLE (FLAGYL) IVPB 500 mg     500 mg 100 mL/hr over 60 Minutes Intravenous  Once 03/29/15 0026 03/29/15 0301   03/29/15  0030  ciprofloxacin (CIPRO) IVPB 500 mg  Status:  Discontinued     500 mg 250 mL/hr over 60 Minutes Intravenous  Once 03/29/15 0026 03/29/15 0027   03/29/15 0030  ciprofloxacin (CIPRO) IVPB 400 mg     400 mg 200 mL/hr over 60 Minutes Intravenous  Once 03/29/15 0027 03/29/15 0240      Assessment/Plan: Shannon [name]/p * No surgery found * continue bowel rest   Continue cipro  and flagyl for now ambulate  LOS: 1 day    TOTH III,Shannon Shannon [name] 03/30/2015

## 2015-03-30 NOTE — Progress Notes (Signed)
Patient ID: Shannon [name], female   DOB: February 09, 1951, 64 y.o.   MRN: [id number] [facility] PROGRESS NOTE  TELICIA OUTWATER [id number] DOB: 05/04/1950 DOA: 03/28/2015 PCP: Mayra Neer, MD  Brief narrative:    64 y.o. female with a past medical history significant for GERD who presented to [facility] ED with abdominal pain especially in right lower quadrant, constant, sharp, 10/10 in intensity associated with diarrhea for past few days prior to this admission.  In the ED, the patient had a low-grade fever, tachycardia, and leukocytosis. A CT of the abdomen / pelvis with contrast showed scant pneumoperitoneum and possible diverticulitis. Pt started on cipro and flagyl. Surgery is following.   Assessment/Plan:    Principal Problem:   Diverticulitis of intestine with perforation without bleeding / Leukocytosis - CT abdomen on admission demonstrated small pneumoperitoneum, likely perforated sigmoid diverticulitis. - Started on Cipro and Flagyl - Still NPO with small amount of ice chips per surgery - Continue IV fluids for hydration  - Continue pain management effort  Active Problems:   GERD - Continue PPI therapy     Hypokalemia - Due to GI losses - Supplemented through IV fluids   DVT Prophylaxis  - Lovenox subQ   Code Status: Full.  Family Communication:  plan of care discussed with the patient Disposition Plan: Home once diverticulitis resolves, likely by 04/01/2015.   IV access:  Peripheral IV  Procedures and diagnostic studies:    Ct Abdomen Pelvis W Contrast 03/29/2015  Small pneumoperitoneum, likely perforated sigmoid diverticulitis. Electronically Signed   By: Monte Fantasia M.D.   On: 03/29/2015 00:24    Medical Consultants:  Surgery  Other Consultants:  Nutrition   IAnti-Infectives:   Cipro and flagyl 03/29/2015 -->   Leisa Lenz, MD  [facility] Pager [phone]  Time spent in minutes: 25 minutes  If 7PM-7AM, please contact  night-coverage [email or web address] Password [facility] 03/30/2015, 12:36 PM   LOS: 1 day    HPI/Subjective: No acute overnight events. Patient reports pain better this am.  Objective: Filed Vitals:   03/29/15 0410 03/29/15 1405 03/29/15 2115 03/30/15 0643  BP:  111/61 119/69 110/64  Pulse:  109 107 85  Temp:  99.7 F (37.6 C) 99.8 F (37.7 C) 99 F (37.2 C)  TempSrc:  Oral Oral Oral  Resp:  18 18 18   Height: 5\' 2"  (1.575 m)     Weight: 82 kg (180 lb 12.4 oz)     SpO2:  95% 97% 97%    Intake/Output Summary (Last 24 hours) at 03/30/15 1236 Last data filed at 03/30/15 1100  Gross per 24 hour  Intake   1600 ml  Output      0 ml  Net   1600 ml    Exam:   General:  Pt is alert, follows commands appropriately, not in acute distress  Cardiovascular: Regular rate and rhythm, S1/S2 (+)  Respiratory: Clear to auscultation bilaterally, no wheezing, no crackles, no rhonchi  Abdomen: Soft, less tender, non distended, bowel sounds present  Extremities: No edema, pulses DP and PT palpable bilaterally  Neuro: Grossly nonfocal  Data Reviewed: Basic Metabolic Panel:  Recent Labs Lab 03/28/15 2119 03/29/15 0656 03/30/15 0534  NA 139  --  144  K 3.6  --  3.2*  CL 107  --  112*  CO2 25  --  22  GLUCOSE 121*  --  92  BUN 13  --  10  CREATININE 0.74 0.69 0.75  CALCIUM 9.1  --  8.4*  Liver Function Tests:  Recent Labs Lab 03/28/15 2119  AST 27  ALT 24  ALKPHOS 66  BILITOT 1.1  PROT 7.5  ALBUMIN 3.8    Recent Labs Lab 03/28/15 2119  LIPASE 18   No results for input(s): AMMONIA in the last 168 hours. CBC:  Recent Labs Lab 03/28/15 2119 03/29/15 0656 03/30/15 0534  WBC 17.2* 12.4* 13.5*  HGB 14.1 14.2 12.9  HCT 41.8 41.7 39.5  MCV 96.5 98.8 100.5*  PLT 277 175 219   Cardiac Enzymes: No results for input(s): CKTOTAL, CKMB, CKMBINDEX, TROPONINI in the last 168 hours. BNP: Invalid input(s): POCBNP CBG: No results for input(s): GLUCAP in the last 168  hours.  No results found for this or any previous visit (from the past 240 hour(s)).   Scheduled Meds: . ciprofloxacin  400 mg Intravenous Q12H  . enoxaparin (LOVENOX) injection  40 mg Subcutaneous Q24H  . meloxicam  15 mg Oral Daily  . metronidazole  500 mg Intravenous Q8H  . omeprazole  40 mg Oral QHS  . sodium chloride  3 mL Intravenous Q12H   Continuous Infusions: . sodium chloride 0.9 % 1,000 mL with potassium chloride 20 mEq infusion

## 2015-03-31 LAB — BASIC METABOLIC PANEL
ANION GAP: 8 (ref 5–15)
BUN: 11 mg/dL (ref 6–20)
CALCIUM: 8.2 mg/dL — AB (ref 8.9–10.3)
CO2: 21 mmol/L — ABNORMAL LOW (ref 22–32)
Chloride: 113 mmol/L — ABNORMAL HIGH (ref 101–111)
Creatinine, Ser: 0.65 mg/dL (ref 0.44–1.00)
GLUCOSE: 75 mg/dL (ref 65–99)
Potassium: 3.1 mmol/L — ABNORMAL LOW (ref 3.5–5.1)
SODIUM: 142 mmol/L (ref 135–145)

## 2015-03-31 LAB — CBC
HCT: 35.3 % — ABNORMAL LOW (ref 36.0–46.0)
HEMOGLOBIN: 11.9 g/dL — AB (ref 12.0–15.0)
MCH: 32.8 pg (ref 26.0–34.0)
MCHC: 33.7 g/dL (ref 30.0–36.0)
MCV: 97.2 fL (ref 78.0–100.0)
Platelets: 225 10*3/uL (ref 150–400)
RBC: 3.63 MIL/uL — AB (ref 3.87–5.11)
RDW: 12.9 % (ref 11.5–15.5)
WBC: 11.1 10*3/uL — AB (ref 4.0–10.5)

## 2015-03-31 MED ORDER — POTASSIUM CHLORIDE IN NACL 20-0.9 MEQ/L-% IV SOLN
INTRAVENOUS | Status: DC
  Administered 2015-03-31: 18:00:00 via INTRAVENOUS
  Filled 2015-03-31 (×2): qty 1000

## 2015-03-31 NOTE — Progress Notes (Signed)
Nutrition Follow-up  DOCUMENTATION CODES:   Obesity unspecified  INTERVENTION:  - Continue diet advancement as medically feasible - RD will continue to monitor for needs  NUTRITION DIAGNOSIS:   Inadequate oral intake related to inability to eat as evidenced by NPO status. -slightly improving with diet advancement to CLD this AM  GOAL:   Patient will meet greater than or equal to 90% of their needs -unmet with CLD  MONITOR:   PO intake, Diet advancement, Weight trends, Labs, Skin, I & O's  ASSESSMENT:   64 y.o. female with a past medical history significant for GERD who presents with abdominal pain. The patient was in her normal state of health until yesterday morning at 5 AM when she woke with severe right lower quadrant pain. The pain was constant, "aching " in character, and associated with tenderness with movement or pressing on her belly. She noted foul-smelling flatulence and diarrhea for the previous several days , but no fever, chills, hematochezia, emesis. She went to her PCP today who sent her to the ER.  12/22 Diet advanced from NPO to CLD around 0830 this AM. Pt states she had red jello and sweet tea and does not have abdominal pain related to this. She denies any experiences of nausea since admission. Pt asks about diet for after d/c. Provided her with handouts from the [employer] outlining low fiber diet for after d/c while still recovering from diverticulitis. Also provided handouts outlining high fiber diet for once diverticulitis has resolved; informed her to follow MD recommendations concerning when transition to high fiber diet should occur.  Pt unable to meet needs on CLD. MD note yesterday (12/21) indicated pt is a possible d/c 12/23 depending on medical course concerning diverticulitis. Medications reviewed. Labs reviewed; K: 3.1 mmol/L, Cl: 113 mmol/L, Ca: 8.2 mg/dL.     12/20 - Pt reports fair appetite recently.  - She had been on a  supervised weight loss program through an MD in [facility] and was also taking a medication in combination with this; unsure of medication at this time. - She states that she stopped taking this medication recently due to it causing dry mouth. - She states that she takes her son out to eat once/week but that she often eats items such as salad, yogurt, and other light foods.  - Pt reports that abdominal pain began yesterday AM around 0500.  - Between that time and admission she consumed a can of Coke and ate saltine crackers and a granola bar; she does not feel that these items exacerbated abdominal pain.  - She states one episode of beginning to feel nauseated but she never had emesis.  - Pt reports that MD dx her with diverticulosi years ago and encouraged her to avoid items with seeds and kernels related to this. - Pt reports that other than this flair she has had only one other issue since dx.  - She states that in April or May 2016 she felt nauseated while out to eat and used the restroom to vomit. - She then felt better for a short period but nauseous feeling returned for several cycles and she was brought to the ED at that time. - No recent weight hx available.  - Pt reports intentional weight loss PTA.  - No muscle or fat wasting noted.    Diet Order:  Diet clear liquid Room service appropriate?: Yes; Fluid consistency:: Thin  Skin:  Reviewed, no issues  Last BM:  12/21  Height:   Ht Readings from Last 1 Encounters:  03/29/15 5\' 2"  (1.575 m)    Weight:   Wt Readings from Last 1 Encounters:  03/29/15 180 lb 12.4 oz (82 kg)    Ideal Body Weight:  50 kg (kg)  BMI:  Body mass index is 33.06 kg/(m^2).  Estimated Nutritional Needs:   Kcal:  1475-1650 (18-20 kcal/kg)  Protein:  60-70 grams  Fluid:  2-2.2 L/day  EDUCATION NEEDS:   Education needs addressed     Jarome Matin, RD, LDN Inpatient Clinical Dietitian Pager # [phone] After hours/weekend pager #  [phone]

## 2015-03-31 NOTE — Progress Notes (Signed)
Patient ID: Shannon [name], female   DOB: 06-02-1950, 64 y.o.   MRN: [id number] [facility] PROGRESS NOTE  Shannon [name] [id number] DOB: [birth year] DOA: 03/28/2015 PCP: Mayra Neer, MD  Brief narrative:    64 y.o. female with a past medical history significant for GERD who presented to [facility] ED with abdominal pain especially in right lower quadrant, constant, sharp, 10/10 in intensity associated with diarrhea for past few days prior to this admission.  In the ED, the patient had a low-grade fever, tachycardia, and leukocytosis. A CT of the abdomen / pelvis with contrast showed scant pneumoperitoneum and possible diverticulitis. Pt started on cipro and flagyl. Surgery is following.   Assessment/Plan:    Principal Problem:   Diverticulitis of intestine with perforation without bleeding / Leukocytosis - CT abdomen on admission demonstrated small pneumoperitoneum, likely perforated sigmoid diverticulitis. - Pt started on Cipro and Flagyl - Per surgery, diet to be advanced to CLD today - Continue IV fluids for hydration  - Continue pain control   Active Problems:   GERD - Continue PPI therapy     Hypokalemia - Secondary to GI losses - Supplemented through fluids   DVT Prophylaxis  - Lovenox subQ in hospital   Code Status: Full.  Family Communication:  plan of care discussed with the patient Disposition Plan: Home once diverticulitis resolves, likely by 04/02/2015.   IV access:  Peripheral IV  Procedures and diagnostic studies:    Ct Abdomen Pelvis W Contrast 03/29/2015  Small pneumoperitoneum, likely perforated sigmoid diverticulitis. Electronically Signed   By: Monte Fantasia M.D.   On: 03/29/2015 00:24    Medical Consultants:  Surgery  Other Consultants:  Nutrition   IAnti-Infectives:   Cipro and flagyl 03/29/2015 -->   Leisa Lenz, MD  [facility] Pager [phone]  Time spent in minutes: 25 minutes  If 7PM-7AM, please contact  night-coverage [email or web address] Password [facility] 03/31/2015, 11:28 AM   LOS: 2 days    HPI/Subjective: No acute overnight events. Patient reports pain better this am.  Objective: Filed Vitals:   03/30/15 0643 03/30/15 1500 03/30/15 2158 03/31/15 0456  BP: 110/64 130/74 148/69 129/74  Pulse: 85 95 99 86  Temp: 99 F (37.2 C) 98.3 F (36.8 C) 98.3 F (36.8 C) 98.3 F (36.8 C)  TempSrc: Oral Oral Oral Oral  Resp: 18 20 20 18   Height:      Weight:      SpO2: 97% 100% 99% 98%    Intake/Output Summary (Last 24 hours) at 03/31/15 1128 Last data filed at 03/30/15 1835  Gross per 24 hour  Intake 1496.25 ml  Output      0 ml  Net 1496.25 ml    Exam:   General:  Pt is alert, not in acute distress  Cardiovascular: RRR, (+) S1, S2  Respiratory: no wheezing, no crackles, no rhonchi  Abdomen: tender in mid abdomen with voluntary guarding, (+) BS  Extremities: No leg swelling, palpable pulses   Neuro: Nonfocal  Data Reviewed: Basic Metabolic Panel:  Recent Labs Lab 03/28/15 2119 03/29/15 0656 03/30/15 0534 03/31/15 0505  NA 139  --  144 142  K 3.6  --  3.2* 3.1*  CL 107  --  112* 113*  CO2 25  --  22 21*  GLUCOSE 121*  --  92 75  BUN 13  --  10 11  CREATININE 0.74 0.69 0.75 0.65  CALCIUM 9.1  --  8.4* 8.2*   Liver Function Tests:  Recent Labs Lab  03/28/15 2119  AST 27  ALT 24  ALKPHOS 66  BILITOT 1.1  PROT 7.5  ALBUMIN 3.8    Recent Labs Lab 03/28/15 2119  LIPASE 18   No results for input(s): AMMONIA in the last 168 hours. CBC:  Recent Labs Lab 03/28/15 2119 03/29/15 0656 03/30/15 0534 03/31/15 0505  WBC 17.2* 12.4* 13.5* 11.1*  HGB 14.1 14.2 12.9 11.9*  HCT 41.8 41.7 39.5 35.3*  MCV 96.5 98.8 100.5* 97.2  PLT 277 175 219 225   Cardiac Enzymes: No results for input(s): CKTOTAL, CKMB, CKMBINDEX, TROPONINI in the last 168 hours. BNP: Invalid input(s): POCBNP CBG: No results for input(s): GLUCAP in the last 168 hours.  No results  found for this or any previous visit (from the past 240 hour(s)).   Scheduled Meds: . ciprofloxacin  400 mg Intravenous Q12H  . enoxaparin (LOVENOX) injection  40 mg Subcutaneous Q24H  . meloxicam  15 mg Oral Daily  . metronidazole  500 mg Intravenous Q8H  . omeprazole  40 mg Oral QHS  . sodium chloride  3 mL Intravenous Q12H   Continuous Infusions: . sodium chloride 0.9 % 1,000 mL with potassium chloride 20 mEq infusion 75 mL/hr at 03/31/15 1108

## 2015-03-31 NOTE — Progress Notes (Signed)
Patient ID: Shannon [name], female   DOB: 1950-06-06, 64 y.o.   MRN: [id number]    Subjective: Pt feeling much better.    Objective: Vital signs in last 24 hours: Temp:  [98.3 F (36.8 C)] 98.3 F (36.8 C) (12/22 0456) Pulse Rate:  [86-99] 86 (12/22 0456) Resp:  [18-20] 18 (12/22 0456) BP: (129-148)/(69-74) 129/74 mmHg (12/22 0456) SpO2:  [98 %-100 %] 98 % (12/22 0456) Last BM Date: 03/30/15  Intake/Output from previous day: 12/21 0701 - 12/22 0700 In: 1596.3 [I.V.:1196.3; IV Piggyback:400] Out: -  Intake/Output this shift:    PE: Abd: soft, much less tender, still somewhat tender in middle portion of her abdomen, but significantly improved. +BS, ND Heart: regular Lungs: CTAB  Lab Results:   Recent Labs  03/30/15 0534 03/31/15 0505  WBC 13.5* 11.1*  HGB 12.9 11.9*  HCT 39.5 35.3*  PLT 219 225   BMET  Recent Labs  03/30/15 0534 03/31/15 0505  NA 144 142  K 3.2* 3.1*  CL 112* 113*  CO2 22 21*  GLUCOSE 92 75  BUN 10 11  CREATININE 0.75 0.65  CALCIUM 8.4* 8.2*   PT/INR No results for input(s): LABPROT, INR in the last 72 hours. CMP     Component Value Date/Time   NA 142 03/31/2015 0505   K 3.1* 03/31/2015 0505   CL 113* 03/31/2015 0505   CO2 21* 03/31/2015 0505   GLUCOSE 75 03/31/2015 0505   BUN 11 03/31/2015 0505   CREATININE 0.65 03/31/2015 0505   CALCIUM 8.2* 03/31/2015 0505   PROT 7.5 03/28/2015 2119   ALBUMIN 3.8 03/28/2015 2119   AST 27 03/28/2015 2119   ALT 24 03/28/2015 2119   ALKPHOS 66 03/28/2015 2119   BILITOT 1.1 03/28/2015 2119   GFRNONAA >60 03/31/2015 0505   GFRAA >60 03/31/2015 0505   Lipase     Component Value Date/Time   LIPASE 18 03/28/2015 2119       Studies/Results: No results found.  Anti-infectives: Anti-infectives    Start     Dose/Rate Route Frequency Ordered Stop   03/29/15 1230  ciprofloxacin (CIPRO) IVPB 400 mg     400 mg 200 mL/hr over 60 Minutes Intravenous Every 12 hours 03/29/15 0612     03/29/15 1000  ciprofloxacin (CIPRO) IVPB 400 mg  Status:  Discontinued     400 mg 200 mL/hr over 60 Minutes Intravenous Every 24 hours 03/29/15 0521 03/29/15 0612   03/29/15 1000  metroNIDAZOLE (FLAGYL) IVPB 500 mg     500 mg 100 mL/hr over 60 Minutes Intravenous Every 8 hours 03/29/15 0521     03/29/15 0030  metroNIDAZOLE (FLAGYL) IVPB 500 mg     500 mg 100 mL/hr over 60 Minutes Intravenous  Once 03/29/15 0026 03/29/15 0301   03/29/15 0030  ciprofloxacin (CIPRO) IVPB 500 mg  Status:  Discontinued     500 mg 250 mL/hr over 60 Minutes Intravenous  Once 03/29/15 0026 03/29/15 0027   03/29/15 0030  ciprofloxacin (CIPRO) IVPB 400 mg     400 mg 200 mL/hr over 60 Minutes Intravenous  Once 03/29/15 0027 03/29/15 0240       Assessment/Plan  1. Diverticulitis with microperforation -advance to clear liquids and see how she does.  Would cont to go slowly -cont Cipro/Flagyl IV for now -cont to follow.   LOS: 2 days    Zophia Marrone E 03/31/2015, 8:28 AM Pager: [phone]

## 2015-04-01 LAB — BASIC METABOLIC PANEL
ANION GAP: 10 (ref 5–15)
BUN: 8 mg/dL (ref 6–20)
CHLORIDE: 111 mmol/L (ref 101–111)
CO2: 22 mmol/L (ref 22–32)
CREATININE: 0.68 mg/dL (ref 0.44–1.00)
Calcium: 8.1 mg/dL — ABNORMAL LOW (ref 8.9–10.3)
GFR calc non Af Amer: >60 mL/min (ref >60)
GLUCOSE: 101 mg/dL — AB (ref 65–99)
Potassium: 3.2 mmol/L — ABNORMAL LOW (ref 3.5–5.1)
Sodium: 143 mmol/L (ref 135–145)

## 2015-04-01 LAB — CBC
HCT: 35.2 % — ABNORMAL LOW (ref 36.0–46.0)
HEMOGLOBIN: 12.1 g/dL (ref 12.0–15.0)
MCH: 32.7 pg (ref 26.0–34.0)
MCHC: 34.4 g/dL (ref 30.0–36.0)
MCV: 95.1 fL (ref 78.0–100.0)
Platelets: 256 10*3/uL (ref 150–400)
RBC: 3.7 MIL/uL — AB (ref 3.87–5.11)
RDW: 12.7 % (ref 11.5–15.5)
WBC: 9.6 10*3/uL (ref 4.0–10.5)

## 2015-04-01 MED ORDER — CIPROFLOXACIN HCL 500 MG PO TABS
500.0000 mg | ORAL_TABLET | Freq: Two times a day (BID) | ORAL | Status: DC
  Administered 2015-04-01 – 2015-04-02 (×3): 500 mg via ORAL
  Filled 2015-04-01 (×5): qty 1

## 2015-04-01 MED ORDER — ALUM & MAG HYDROXIDE-SIMETH 200-200-20 MG/5ML PO SUSP
15.0000 mL | ORAL | Status: DC | PRN
  Administered 2015-04-01 – 2015-04-02 (×3): 15 mL via ORAL
  Filled 2015-04-01 (×3): qty 30

## 2015-04-01 MED ORDER — METRONIDAZOLE 500 MG PO TABS
500.0000 mg | ORAL_TABLET | Freq: Three times a day (TID) | ORAL | Status: DC
  Administered 2015-04-01 – 2015-04-02 (×4): 500 mg via ORAL
  Filled 2015-04-01 (×6): qty 1

## 2015-04-01 MED ORDER — HYDROCODONE-ACETAMINOPHEN 5-325 MG PO TABS: 1.0000 | ORAL_TABLET | Freq: Four times a day (QID) | ORAL | Status: DC | PRN

## 2015-04-01 NOTE — Progress Notes (Addendum)
Patient ID: Shannon [name], female   DOB: Aug 17, 1950, 64 y.o.   MRN: [id number] [facility] PROGRESS NOTE  Shannon [name] [id number] DOB: 14-Feb-1951 DOA: 03/28/2015 PCP: Mayra Neer, MD  Brief narrative:    64 y.o. female with a past medical history significant for GERD who presented to [facility] ED with abdominal pain especially in right lower quadrant, constant, sharp, 10/10 in intensity associated with diarrhea for past few days prior to this admission.  In the ED, the patient had a low-grade fever, tachycardia, and leukocytosis. A CT of the abdomen / pelvis with contrast showed scant pneumoperitoneum and possible diverticulitis. Pt started on cipro and flagyl. Surgery on board.   Assessment/Plan:    Principal Problem:   Diverticulitis of intestine with perforation without bleeding / Leukocytosis - CT abdomen on admission demonstrated small pneumoperitoneum, likely perforated sigmoid diverticulitis. - Change to PO cipro and flagyl today- Diet full liquid, change to soft in am - Pain controlled - Surgery ok for discharge if tolerates solids tomorrow   Active Problems:   GERD - Continue Prilosec - Added maalox as well as GERD not adequately controlled with prilosec     Hypokalemia - Due to GI losses  - Supplemented through fluids   DVT Prophylaxis  - Lovenox subQ   Code Status: Full.  Family Communication:  plan of care discussed with the patient Disposition Plan: Home 12/24  IV access:  Peripheral IV  Procedures and diagnostic studies:    Ct Abdomen Pelvis W Contrast 03/29/2015  Small pneumoperitoneum, likely perforated sigmoid diverticulitis. Electronically Signed   By: Monte Fantasia M.D.   On: 03/29/2015 00:24   Medical Consultants:  Surgery  Other Consultants:  Nutrition   IAnti-Infectives:   Cipro and flagyl 03/29/2015 -->   Leisa Lenz, MD  [facility] Pager [phone]  Time spent in minutes: 15 minutes  If 7PM-7AM, please  contact night-coverage [email or web address] Password [facility] 04/01/2015, 12:26 PM   LOS: 3 days    HPI/Subjective: No acute overnight events. Patient reports no pain.   Objective: Filed Vitals:   03/31/15 0456 03/31/15 1500 03/31/15 2046 04/01/15 0456  BP: 129/74 145/77 140/79 136/74  Pulse: 86 97 99 85  Temp: 98.3 F (36.8 C) 98.2 F (36.8 C) 98.1 F (36.7 C) 98.5 F (36.9 C)  TempSrc: Oral Axillary Axillary Oral  Resp: 18 20 18 18   Height:      Weight:      SpO2: 98% 98% 99% 99%   No intake or output data in the 24 hours ending 04/01/15 1226  Exam:   General:  Pt is alert, awake  Cardiovascular: Rate controlled, appreciate S1, S2  Respiratory: bilateral air entry, no wheezing   Abdomen: BS appreciated, nontender abd  Extremities: No edema, palpable pulses   Neuro: No focal deficits   Data Reviewed: Basic Metabolic Panel:  Recent Labs Lab 03/28/15 2119 03/29/15 0656 03/30/15 0534 03/31/15 0505 04/01/15 0520  NA 139  --  144 142 143  K 3.6  --  3.2* 3.1* 3.2*  CL 107  --  112* 113* 111  CO2 25  --  22 21* 22  GLUCOSE 121*  --  92 75 101*  BUN 13  --  10 11 8   CREATININE 0.74 0.69 0.75 0.65 0.68  CALCIUM 9.1  --  8.4* 8.2* 8.1*   Liver Function Tests:  Recent Labs Lab 03/28/15 2119  AST 27  ALT 24  ALKPHOS 66  BILITOT 1.1  PROT 7.5  ALBUMIN 3.8  Recent Labs Lab 03/28/15 2119  LIPASE 18   No results for input(s): AMMONIA in the last 168 hours. CBC:  Recent Labs Lab 03/28/15 2119 03/29/15 0656 03/30/15 0534 03/31/15 0505 04/01/15 0520  WBC 17.2* 12.4* 13.5* 11.1* 9.6  HGB 14.1 14.2 12.9 11.9* 12.1  HCT 41.8 41.7 39.5 35.3* 35.2*  MCV 96.5 98.8 100.5* 97.2 95.1  PLT 277 175 219 225 256   Cardiac Enzymes: No results for input(s): CKTOTAL, CKMB, CKMBINDEX, TROPONINI in the last 168 hours. BNP: Invalid input(s): POCBNP CBG: No results for input(s): GLUCAP in the last 168 hours.  No results found for this or any previous visit  (from the past 240 hour(s)).   Scheduled Meds: . ciprofloxacin  500 mg Oral BID  . meloxicam  15 mg Oral Daily  . metroNIDAZOLE  500 mg Oral 3 times per day  . omeprazole  40 mg Oral QHS  . sodium chloride  3 mL Intravenous Q12H   Continuous Infusions:

## 2015-04-01 NOTE — Progress Notes (Signed)
Date: April 01, 2015 Chart reviewed for concurrent status and case management needs. Will continue to follow patient for changes and needs: Velva Harman, RN, BSN, [place]   [phone]

## 2015-04-01 NOTE — Progress Notes (Signed)
Patient ID: Shannon [name], female   DOB: 15-Apr-1950, 64 y.o.   MRN: [id number]    Subjective: Doing well with clear liquids.  No increase in abdominal pain.  Objective: Vital signs in last 24 hours: Temp:  [98.1 F (36.7 C)-98.5 F (36.9 C)] 98.5 F (36.9 C) (12/23 0456) Pulse Rate:  [85-99] 85 (12/23 0456) Resp:  [18-20] 18 (12/23 0456) BP: (136-145)/(74-79) 136/74 mmHg (12/23 0456) SpO2:  [98 %-99 %] 99 % (12/23 0456) Last BM Date: 03/31/15  Intake/Output from previous day:   Intake/Output this shift:    PE: Abd: soft, less tender, almost now NT, NS, +BS Heart: regular Lungs: CTAB  Lab Results:   Recent Labs  03/31/15 0505 04/01/15 0520  WBC 11.1* 9.6  HGB 11.9* 12.1  HCT 35.3* 35.2*  PLT 225 256   BMET  Recent Labs  03/31/15 0505 04/01/15 0520  NA 142 143  K 3.1* 3.2*  CL 113* 111  CO2 21* 22  GLUCOSE 75 101*  BUN 11 8  CREATININE 0.65 0.68  CALCIUM 8.2* 8.1*   PT/INR No results for input(s): LABPROT, INR in the last 72 hours. CMP     Component Value Date/Time   NA 143 04/01/2015 0520   K 3.2* 04/01/2015 0520   CL 111 04/01/2015 0520   CO2 22 04/01/2015 0520   GLUCOSE 101* 04/01/2015 0520   BUN 8 04/01/2015 0520   CREATININE 0.68 04/01/2015 0520   CALCIUM 8.1* 04/01/2015 0520   PROT 7.5 03/28/2015 2119   ALBUMIN 3.8 03/28/2015 2119   AST 27 03/28/2015 2119   ALT 24 03/28/2015 2119   ALKPHOS 66 03/28/2015 2119   BILITOT 1.1 03/28/2015 2119   GFRNONAA >60 04/01/2015 0520   GFRAA >60 04/01/2015 0520   Lipase     Component Value Date/Time   LIPASE 18 03/28/2015 2119       Studies/Results: No results found.  Anti-infectives: Anti-infectives    Start     Dose/Rate Route Frequency Ordered Stop   03/29/15 1230  ciprofloxacin (CIPRO) IVPB 400 mg     400 mg 200 mL/hr over 60 Minutes Intravenous Every 12 hours 03/29/15 0612     03/29/15 1000  ciprofloxacin (CIPRO) IVPB 400 mg  Status:  Discontinued     400 mg 200 mL/hr over  60 Minutes Intravenous Every 24 hours 03/29/15 0521 03/29/15 0612   03/29/15 1000  metroNIDAZOLE (FLAGYL) IVPB 500 mg     500 mg 100 mL/hr over 60 Minutes Intravenous Every 8 hours 03/29/15 0521     03/29/15 0030  metroNIDAZOLE (FLAGYL) IVPB 500 mg     500 mg 100 mL/hr over 60 Minutes Intravenous  Once 03/29/15 0026 03/29/15 0301   03/29/15 0030  ciprofloxacin (CIPRO) IVPB 500 mg  Status:  Discontinued     500 mg 250 mL/hr over 60 Minutes Intravenous  Once 03/29/15 0026 03/29/15 0027   03/29/15 0030  ciprofloxacin (CIPRO) IVPB 400 mg     400 mg 200 mL/hr over 60 Minutes Intravenous  Once 03/29/15 0027 03/29/15 0240       Assessment/Plan  1. Diverticulitis with microperforation -advance to full liquids, then likely to soft diet in am -may transition to oral abx therapy today to make sure she doesn't worsen prior to DC.  This will also help her disconnect from her IV which caused many problems overnight -will need follow up with [place] in our office in 3 weeks after discharge -total of 10-14 days of abx therapy  LOS: 3 days    Shannon [name] E 04/01/2015, 8:13 AM Pager: [phone]

## 2015-04-02 MED ORDER — POTASSIUM CHLORIDE CRYS ER 20 MEQ PO TBCR
40.0000 meq | EXTENDED_RELEASE_TABLET | Freq: Once | ORAL | Status: AC
  Administered 2015-04-02: 40 meq via ORAL
  Filled 2015-04-02: qty 2

## 2015-04-02 MED ORDER — ACETAMINOPHEN 325 MG PO TABS: 650.0000 mg | ORAL_TABLET | Freq: Four times a day (QID) | ORAL | Status: DC | PRN

## 2015-04-02 MED ORDER — METRONIDAZOLE 500 MG PO TABS: 500.0000 mg | ORAL_TABLET | Freq: Three times a day (TID) | ORAL | Status: DC

## 2015-04-02 MED ORDER — CIPROFLOXACIN HCL 500 MG PO TABS: 500.0000 mg | ORAL_TABLET | Freq: Two times a day (BID) | ORAL | Status: DC

## 2015-04-02 MED ORDER — ALUM & MAG HYDROXIDE-SIMETH 200-200-20 MG/5ML PO SUSP: 15.0000 mL | ORAL | Status: AC | PRN

## 2015-04-02 NOTE — Progress Notes (Signed)
Pt has left at this time with her son. Alert, oriented, and without c/o. Discharge instructions/prescriptions given/explained with pt verbalizing understanding. Followup appointments noted.

## 2015-04-02 NOTE — Progress Notes (Signed)
Subjective: No pain.  Tolerating full liquids.  Objective: Vital signs in last 24 hours: Temp:  [97.6 F (36.4 C)-98.4 F (36.9 C)] 98.4 F (36.9 C) (12/24 0600) Pulse Rate:  [83-88] 83 (12/24 0600) Resp:  [18-20] 18 (12/24 0600) BP: (129-150)/(72-75) 129/73 mmHg (12/24 0600) SpO2:  [97 %-99 %] 97 % (12/24 0600) Last BM Date: 03/31/15  Intake/Output from previous day: 12/23 0701 - 12/24 0700 In: 480 [P.O.:480] Out: 5 [Urine:5] Intake/Output this shift:    PE: General- In NAD Abdomen-soft, not tender  Lab Results:   Recent Labs  03/31/15 0505 04/01/15 0520  WBC 11.1* 9.6  HGB 11.9* 12.1  HCT 35.3* 35.2*  PLT 225 256   BMET  Recent Labs  03/31/15 0505 04/01/15 0520  NA 142 143  K 3.1* 3.2*  CL 113* 111  CO2 21* 22  GLUCOSE 75 101*  BUN 11 8  CREATININE 0.65 0.68  CALCIUM 8.2* 8.1*   PT/INR No results for input(s): LABPROT, INR in the last 72 hours. Comprehensive Metabolic Panel:    Component Value Date/Time   NA 143 04/01/2015 0520   NA 142 03/31/2015 0505   K 3.2* 04/01/2015 0520   K 3.1* 03/31/2015 0505   CL 111 04/01/2015 0520   CL 113* 03/31/2015 0505   CO2 22 04/01/2015 0520   CO2 21* 03/31/2015 0505   BUN 8 04/01/2015 0520   BUN 11 03/31/2015 0505   CREATININE 0.68 04/01/2015 0520   CREATININE 0.65 03/31/2015 0505   GLUCOSE 101* 04/01/2015 0520   GLUCOSE 75 03/31/2015 0505   CALCIUM 8.1* 04/01/2015 0520   CALCIUM 8.2* 03/31/2015 0505   AST 27 03/28/2015 2119   AST 28 07/16/2014 1433   ALT 24 03/28/2015 2119   ALT 24 07/16/2014 1433   ALKPHOS 66 03/28/2015 2119   ALKPHOS 80 07/16/2014 1433   BILITOT 1.1 03/28/2015 2119   BILITOT 0.8 07/16/2014 1433   PROT 7.5 03/28/2015 2119   PROT 8.4* 07/16/2014 1433   ALBUMIN 3.8 03/28/2015 2119   ALBUMIN 4.3 07/16/2014 1433     Studies/Results: No results found.  Anti-infectives: Anti-infectives    Start     Dose/Rate Route Frequency Ordered Stop   04/01/15 1600  metroNIDAZOLE  (FLAGYL) tablet 500 mg     500 mg Oral 3 times per day 04/01/15 1225     04/01/15 1230  ciprofloxacin (CIPRO) tablet 500 mg     500 mg Oral 2 times daily 04/01/15 1225     03/29/15 1230  ciprofloxacin (CIPRO) IVPB 400 mg  Status:  Discontinued     400 mg 200 mL/hr over 60 Minutes Intravenous Every 12 hours 03/29/15 0612 04/01/15 1225   03/29/15 1000  ciprofloxacin (CIPRO) IVPB 400 mg  Status:  Discontinued     400 mg 200 mL/hr over 60 Minutes Intravenous Every 24 hours 03/29/15 0521 03/29/15 0612   03/29/15 1000  metroNIDAZOLE (FLAGYL) IVPB 500 mg  Status:  Discontinued     500 mg 100 mL/hr over 60 Minutes Intravenous Every 8 hours 03/29/15 0521 04/01/15 1225   03/29/15 0030  metroNIDAZOLE (FLAGYL) IVPB 500 mg     500 mg 100 mL/hr over 60 Minutes Intravenous  Once 03/29/15 0026 03/29/15 0301   03/29/15 0030  ciprofloxacin (CIPRO) IVPB 500 mg  Status:  Discontinued     500 mg 250 mL/hr over 60 Minutes Intravenous  Once 03/29/15 0026 03/29/15 0027   03/29/15 0030  ciprofloxacin (CIPRO) IVPB 400 mg  400 mg 200 mL/hr over 60 Minutes Intravenous  Once 03/29/15 0027 03/29/15 0240      Assessment Principal Problem:   Acute, uncomplicated sigmoid diverticulitis-continues to improve; this is her first episode so no surgery recommended unless she has repeated episodes   LOS: 4 days   Rec: Low fiber diet and if tolerated may be discharged.  Should stay on that diet for at least 3 weeks then transition to high fiber diet.  Two weeks total of abxs.  Follow up in 2-3 weeks with PCP.  Needs colonoscopy 6-8 weeks after this event to r/o occult neoplasm.   Ercole Georg J 04/02/2015

## 2015-04-02 NOTE — Discharge Instructions (Addendum)
Low-Fiber Diet °Fiber is found in fruits, vegetables, and whole grains. A low-fiber diet restricts fibrous foods that are not digested in the small intestine. A diet containing about 10-15 grams of fiber per day is considered low fiber. Low-fiber diets may be used to: °· Promote healing and rest the bowel during intestinal flare-ups. °· Prevent blockage of a partially obstructed or narrowed gastrointestinal tract. °· Reduce fecal weight and volume. °· Slow the movement of feces. °You may be on a low-fiber diet as a transitional diet following surgery, after an injury (trauma), or because of a short (acute) or lifelong (chronic) illness. Your health care provider will determine the length of time you need to stay on this diet.  °WHAT DO I NEED TO KNOW ABOUT A LOW-FIBER DIET? °Always check the fiber content on the packaging's Nutrition Facts label, especially on foods from the grains list. Ask your dietitian if you have questions about specific foods that are related to your condition, especially if the food is not listed below. In general, a low-fiber food will have less than 2 g of fiber. °WHAT FOODS CAN I EAT? °Grains °All breads and crackers made with white flour. Sweet rolls, doughnuts, waffles, pancakes, French toast, bagels. Pretzels, Melba toast, zwieback. Well-cooked cereals, such as cornmeal, farina, or cream cereals. Dry cereals that do not contain whole grains, fruit, or nuts, such as refined corn, wheat, rice, and oat cereals. Potatoes prepared any way without skins, plain pastas and noodles, refined white rice. Use white flour for baking and making sauces. Use allowed list of grains for casseroles, dumplings, and puddings.  °Vegetables °Strained tomato and vegetable juices. Fresh lettuce, cucumber, spinach. Well-cooked (no skin or pulp) or canned vegetables, such as asparagus, bean sprouts, beets, carrots, green beans, mushrooms, potatoes, pumpkin, spinach, yellow squash, tomato sauce/puree, turnips,  yams, and zucchini. Keep servings limited to ½ cup.  °Fruits °All fruit juices except prune juice. Cooked or canned fruits without skin and seeds, such as applesauce, apricots, cherries, fruit cocktail, grapefruit, grapes, mandarin oranges, melons, peaches, pears, pineapple, and plums. Fresh fruits without skin, such as apricots, avocados, bananas, melons, pineapple, nectarines, and peaches. Keep servings limited to ½ cup or 1 piece.   °Meat and Other Protein Sources °Ground or well-cooked tender beef, ham, veal, lamb, pork, or poultry. Eggs, plain cheese. Fish, oysters, shrimp, lobster, and other seafood. Liver, organ meats. Smooth nut butters. °Dairy °All milk products and alternative dairy substitutes, such as soy, rice, almond, and coconut, not containing added whole nuts, seeds, or added fruit. °Beverages °Decaf coffee, fruit, and vegetable juices or smoothies (small amounts, with no pulp or skins, and with fruits from allowed list), sports drinks, herbal tea. °Condiments °Ketchup, mustard, vinegar, cream sauce, cheese sauce, cocoa powder. Spices in moderation, such as allspice, basil, bay leaves, celery powder or leaves, cinnamon, cumin powder, curry powder, ginger, mace, marjoram, onion or garlic powder, oregano, paprika, parsley flakes, ground pepper, rosemary, sage, savory, tarragon, thyme, and turmeric. °Sweets and Desserts °Plain cakes and cookies, pie made with allowed fruit, pudding, custard, cream pie. Gelatin, fruit, ice, sherbet, frozen ice pops. Ice cream, ice milk without nuts. Plain hard candy, honey, jelly, molasses, syrup, sugar, chocolate syrup, gumdrops, marshmallows. Limit overall sugar intake.  °Fats and Oil °Margarine, butter, cream, mayonnaise, salad oils, plain salad dressings made from allowed foods. Choose healthy fats such as olive oil, canola oil, and omega-3 fatty acids (such as found in salmon or tuna) when possible.  °Other °Bouillon, broth, or cream soups made   from allowed foods.  Any strained soup. Casseroles or mixed dishes made with allowed foods. The items listed above may not be a complete list of recommended foods or beverages. Contact your dietitian for more options.  WHAT FOODS ARE NOT RECOMMENDED? Grains All whole wheat and whole grain breads and crackers. Multigrains, rye, bran seeds, nuts, or coconut. Cereals containing whole grains, multigrains, bran, coconut, nuts, raisins. Cooked or dry oatmeal, steel-cut oats. Coarse wheat cereals, granola. Cereals advertised as high fiber. Potato skins. Whole grain pasta, wild or brown rice. Popcorn. Coconut flour. Bran, buckwheat, corn bread, multigrains, rye, wheat germ.  Vegetables Fresh, cooked or canned vegetables, such as artichokes, asparagus, beet greens, broccoli, Brussels sprouts, cabbage, celery, cauliflower, corn, eggplant, kale, legumes or beans, okra, peas, and tomatoes. Avoid large servings of any vegetables, especially raw vegetables.  Fruits Fresh fruits, such as apples with or without skin, berries, cherries, figs, grapes, grapefruit, guavas, kiwis, mangoes, oranges, papayas, pears, persimmons, pineapple, and pomegranate. Prune juice and juices with pulp, stewed or dried prunes. Dried fruits, dates, raisins. Fruit seeds or skins. Avoid large servings of all fresh fruits. Meats and Other Protein Sources Tough, fibrous meats with gristle. Chunky nut butter. Cheese made with seeds, nuts, or other foods not recommended. Nuts, seeds, legumes (beans, including baked beans), dried peas, beans, lentils.  Dairy Yogurt or cheese that contains nuts, seeds, or added fruit.  Beverages Fruit juices with high pulp, prune juice. Caffeinated coffee and teas.  Condiments Coconut, maple syrup, pickles, olives. Sweets and Desserts Desserts, cookies, or candies that contain nuts or coconut, chunky peanut butter, dried fruits. Jams, preserves with seeds, marmalade. Large amounts of sugar and sweets. Any other dessert made with  fruits from the not recommended list.  Other Soups made from vegetables that are not recommended or that contain other foods not recommended.  The items listed above may not be a complete list of foods and beverages to avoid. Contact your dietitian for more information.   This information is not intended to replace advice given to you by your health care provider. Make sure you discuss any questions you have with your health care provider.   Document Released: 09/15/2001 Document Revised: 03/31/2013 Document Reviewed: 02/16/2013 Elsevier Interactive Patient Education 2016 [employer].   Diverticulitis Diverticulitis is inflammation or infection of small pouches in your colon that form when you have a condition called diverticulosis. The pouches in your colon are called diverticula. Your colon, or large intestine, is where water is absorbed and stool is formed. Complications of diverticulitis can include:  Bleeding.  Severe infection.  Severe pain.  Perforation of your colon.  Obstruction of your colon. CAUSES  Diverticulitis is caused by bacteria. Diverticulitis happens when stool becomes trapped in diverticula. This allows bacteria to grow in the diverticula, which can lead to inflammation and infection. RISK FACTORS People with diverticulosis are at risk for diverticulitis. Eating a diet that does not include enough fiber from fruits and vegetables may make diverticulitis more likely to develop. SYMPTOMS  Symptoms of diverticulitis may include:  Abdominal pain and tenderness. The pain is normally located on the left side of the abdomen, but may occur in other areas.  Fever and chills.  Bloating.  Cramping.  Nausea.  Vomiting.  Constipation.  Diarrhea.  Blood in your stool. DIAGNOSIS  Your health care provider will ask you about your medical history and do a physical exam. You may need to have tests done because many medical conditions can cause the same symptoms  as diverticulitis. Tests may include:  Blood tests.  Urine tests.  Imaging tests of the abdomen, including X-rays and CT scans. When your condition is under control, your health care provider may recommend that you have a colonoscopy. A colonoscopy can show how severe your diverticula are and whether something else is causing your symptoms. TREATMENT  Most cases of diverticulitis are mild and can be treated at home. Treatment may include:  Taking over-the-counter pain medicines.  Following a clear liquid diet.  Taking antibiotic medicines by mouth for 7-10 days. More severe cases may be treated at a hospital. Treatment may include:  Not eating or drinking.  Taking prescription pain medicine.  Receiving antibiotic medicines through an IV tube.  Receiving fluids and nutrition through an IV tube.  Surgery. HOME CARE INSTRUCTIONS   Follow your health care provider's instructions carefully.  Follow a full liquid diet or other diet as directed by your health care provider. After your symptoms improve, your health care provider may tell you to change your diet. He or she may recommend you eat a high-fiber diet. Fruits and vegetables are good sources of fiber. Fiber makes it easier to pass stool.  Take fiber supplements or probiotics as directed by your health care provider.  Only take medicines as directed by your health care provider.  Keep all your follow-up appointments. SEEK MEDICAL CARE IF:   Your pain does not improve.  You have a hard time eating food.  Your bowel movements do not return to normal. SEEK IMMEDIATE MEDICAL CARE IF:   Your pain becomes worse.  Your symptoms do not get better.  Your symptoms suddenly get worse.  You have a fever.  You have repeated vomiting.  You have bloody or black, tarry stools. MAKE SURE YOU:   Understand these instructions.  Will watch your condition.  Will get help right away if you are not doing well or get worse.     This information is not intended to replace advice given to you by your health care provider. Make sure you discuss any questions you have with your health care provider.   Document Released: 01/03/2005 Document Revised: 03/31/2013 Document Reviewed: 02/18/2013 Elsevier Interactive Patient Education 2016 [employer].  Low-Fiber Diet for 2-3 weeks, then switch to high fiber Fiber is found in fruits, vegetables, and whole grains. A low-fiber diet restricts fibrous foods that are not digested in the small intestine. A diet containing about 10-15 grams of fiber per day is considered low fiber. Low-fiber diets may be used to:  Promote healing and rest the bowel during intestinal flare-ups.  Prevent blockage of a partially obstructed or narrowed gastrointestinal tract.  Reduce fecal weight and volume.  Slow the movement of feces. You may be on a low-fiber diet as a transitional diet following surgery, after an injury (trauma), or because of a short (acute) or lifelong (chronic) illness. Your health care provider will determine the length of time you need to stay on this diet.  WHAT DO I NEED TO KNOW ABOUT A LOW-FIBER DIET? Always check the fiber content on the packaging's Nutrition Facts label, especially on foods from the grains list. Ask your dietitian if you have questions about specific foods that are related to your condition, especially if the food is not listed below. In general, a low-fiber food will have less than 2 g of fiber. WHAT FOODS CAN I EAT? Grains All breads and crackers made with white flour. Sweet rolls, doughnuts, waffles, pancakes, [place] toast, bagels.  Pretzels, Melba toast, zwieback. Well-cooked cereals, such as cornmeal, farina, or cream cereals. Dry cereals that do not contain whole grains, fruit, or nuts, such as refined corn, wheat, rice, and oat cereals. Potatoes prepared any way without skins, plain pastas and noodles, refined white rice. Use white flour for baking and  making sauces. Use allowed list of grains for casseroles, dumplings, and puddings.  Vegetables Strained tomato and vegetable juices. Fresh lettuce, cucumber, spinach. Well-cooked (no skin or pulp) or canned vegetables, such as asparagus, bean sprouts, beets, carrots, green beans, mushrooms, potatoes, pumpkin, spinach, yellow squash, tomato sauce/puree, turnips, yams, and zucchini. Keep servings limited to  cup.  Fruits All fruit juices except prune juice. Cooked or canned fruits without skin and seeds, such as applesauce, apricots, cherries, fruit cocktail, grapefruit, grapes, mandarin oranges, melons, peaches, pears, pineapple, and plums. Fresh fruits without skin, such as apricots, avocados, bananas, melons, pineapple, nectarines, and peaches. Keep servings limited to  cup or 1 piece.  Meat and Other Protein Sources Ground or well-cooked tender beef, ham, veal, lamb, pork, or poultry. Eggs, plain cheese. Fish, oysters, shrimp, lobster, and other seafood. Liver, organ meats. Smooth nut butters. Dairy All milk products and alternative dairy substitutes, such as soy, rice, almond, and coconut, not containing added whole nuts, seeds, or added fruit. Beverages Decaf coffee, fruit, and vegetable juices or smoothies (small amounts, with no pulp or skins, and with fruits from allowed list), sports drinks, herbal tea. Condiments Ketchup, mustard, vinegar, cream sauce, cheese sauce, cocoa powder. Spices in moderation, such as allspice, basil, bay leaves, celery powder or leaves, cinnamon, cumin powder, curry powder, ginger, mace, marjoram, onion or garlic powder, oregano, paprika, parsley flakes, ground pepper, rosemary, sage, savory, tarragon, thyme, and turmeric. Sweets and Desserts Plain cakes and cookies, pie made with allowed fruit, pudding, custard, cream pie. Gelatin, fruit, ice, sherbet, frozen ice pops. Ice cream, ice milk without nuts. Plain hard candy, honey, jelly, molasses, syrup, sugar,  chocolate syrup, gumdrops, marshmallows. Limit overall sugar intake.  Fats and Oil Margarine, butter, cream, mayonnaise, salad oils, plain salad dressings made from allowed foods. Choose healthy fats such as olive oil, canola oil, and omega-3 fatty acids (such as found in salmon or tuna) when possible.  Other Bouillon, broth, or cream soups made from allowed foods. Any strained soup. Casseroles or mixed dishes made with allowed foods. The items listed above may not be a complete list of recommended foods or beverages. Contact your dietitian for more options.  WHAT FOODS ARE NOT RECOMMENDED? Grains All whole wheat and whole grain breads and crackers. Multigrains, rye, bran seeds, nuts, or coconut. Cereals containing whole grains, multigrains, bran, coconut, nuts, raisins. Cooked or dry oatmeal, steel-cut oats. Coarse wheat cereals, granola. Cereals advertised as high fiber. Potato skins. Whole grain pasta, wild or brown rice. Popcorn. Coconut flour. Bran, buckwheat, corn bread, multigrains, rye, wheat germ.  Vegetables Fresh, cooked or canned vegetables, such as artichokes, asparagus, beet greens, broccoli, Brussels sprouts, cabbage, celery, cauliflower, corn, eggplant, kale, legumes or beans, okra, peas, and tomatoes. Avoid large servings of any vegetables, especially raw vegetables.  Fruits Fresh fruits, such as apples with or without skin, berries, cherries, figs, grapes, grapefruit, guavas, kiwis, mangoes, oranges, papayas, pears, persimmons, pineapple, and pomegranate. Prune juice and juices with pulp, stewed or dried prunes. Dried fruits, dates, raisins. Fruit seeds or skins. Avoid large servings of all fresh fruits. Meats and Other Protein Sources Tough, fibrous meats with gristle. Chunky nut butter. Cheese made with seeds, nuts,  or other foods not recommended. Nuts, seeds, legumes (beans, including baked beans), dried peas, beans, lentils.  Dairy Yogurt or cheese that contains nuts, seeds, or  added fruit.  Beverages Fruit juices with high pulp, prune juice. Caffeinated coffee and teas.  Condiments Coconut, maple syrup, pickles, olives. Sweets and Desserts Desserts, cookies, or candies that contain nuts or coconut, chunky peanut butter, dried fruits. Jams, preserves with seeds, marmalade. Large amounts of sugar and sweets. Any other dessert made with fruits from the not recommended list.  Other Soups made from vegetables that are not recommended or that contain other foods not recommended.  The items listed above may not be a complete list of foods and beverages to avoid. Contact your dietitian for more information.   This information is not intended to replace advice given to you by your health care provider. Make sure you discuss any questions you have with your health care provider.   Document Released: 09/15/2001 Document Revised: 03/31/2013 Document Reviewed: 02/16/2013 Elsevier Interactive Patient Education 2016 [employer]  High-Fiber Diet Fiber, also called dietary fiber, is a type of carbohydrate found in fruits, vegetables, whole grains, and beans. A high-fiber diet can have many health benefits. Your health care provider may recommend a high-fiber diet to help:  Prevent constipation. Fiber can make your bowel movements more regular.  Lower your cholesterol.  Relieve hemorrhoids, uncomplicated diverticulosis, or irritable bowel syndrome.  Prevent overeating as part of a weight-loss plan.  Prevent heart disease, type 2 diabetes, and certain cancers. WHAT IS MY PLAN? The recommended daily intake of fiber includes:  38 grams for men under age 23.  96 grams for men over age 77.  15 grams for women under age 35.  78 grams for women over age 56. You can get the recommended daily intake of dietary fiber by eating a variety of fruits, vegetables, grains, and beans. Your health care provider may also recommend a fiber supplement if it is not possible to get enough  fiber through your diet. WHAT DO I NEED TO KNOW ABOUT A HIGH-FIBER DIET?  Fiber supplements have not been widely studied for their effectiveness, so it is better to get fiber through food sources.  Always check the fiber content on thenutrition facts label of any prepackaged food. Look for foods that contain at least 5 grams of fiber per serving.  Ask your dietitian if you have questions about specific foods that are related to your condition, especially if those foods are not listed in the following section.  Increase your daily fiber consumption gradually. Increasing your intake of dietary fiber too quickly may cause bloating, cramping, or gas.  Drink plenty of water. Water helps you to digest fiber. WHAT FOODS CAN I EAT? Grains Whole-grain breads. Multigrain cereal. Oats and oatmeal. Brown rice. Barley. Bulgur wheat. [place]. Bran muffins. Popcorn. Rye wafer crackers. Vegetables Sweet potatoes. Spinach. Kale. Artichokes. Cabbage. Broccoli. Green peas. Carrots. Squash. Fruits Berries. Pears. Apples. Oranges. Avocados. Prunes and raisins. Dried figs. Meats and Other Protein Sources [place], kidney, pinto, and soy beans. Split peas. Lentils. Nuts and seeds. Dairy Fiber-fortified yogurt. Beverages Fiber-fortified soy milk. Fiber-fortified orange juice. Other Fiber bars. The items listed above may not be a complete list of recommended foods or beverages. Contact your dietitian for more options. WHAT FOODS ARE NOT RECOMMENDED? Grains White bread. Pasta made with refined flour. White rice. Vegetables Fried potatoes. Canned vegetables. Well-cooked vegetables.  Fruits Fruit juice. Cooked, strained fruit. Meats and Other Protein Sources Fatty cuts of  meat. [place] poultry or fried fish. Dairy Milk. Yogurt. Cream cheese. Sour cream. Beverages Soft drinks. Other Cakes and pastries. Butter and oils. The items listed above may not be a complete list of foods and beverages to avoid. Contact  your dietitian for more information. WHAT ARE SOME TIPS FOR INCLUDING HIGH-FIBER FOODS IN MY DIET?  Eat a wide variety of high-fiber foods.  Make sure that half of all grains consumed each day are whole grains.  Replace breads and cereals made from refined flour or white flour with whole-grain breads and cereals.  Replace white rice with brown rice, bulgur wheat, or millet.  Start the day with a breakfast that is high in fiber, such as a cereal that contains at least 5 grams of fiber per serving.  Use beans in place of meat in soups, salads, or pasta.  Eat high-fiber snacks, such as berries, raw vegetables, nuts, or popcorn.   This information is not intended to replace advice given to you by your health care provider. Make sure you discuss any questions you have with your health care provider.   Document Released: 03/26/2005 Document Revised: 04/16/2014 Document Reviewed: 09/08/2013 Elsevier Interactive Patient Education 2016 [employer].  Diverticulitis Diverticulitis is inflammation or infection of small pouches in your colon that form when you have a condition called diverticulosis. The pouches in your colon are called diverticula. Your colon, or large intestine, is where water is absorbed and stool is formed. Complications of diverticulitis can include:  Bleeding.  Severe infection.  Severe pain.  Perforation of your colon.  Obstruction of your colon. CAUSES  Diverticulitis is caused by bacteria. Diverticulitis happens when stool becomes trapped in diverticula. This allows bacteria to grow in the diverticula, which can lead to inflammation and infection. RISK FACTORS People with diverticulosis are at risk for diverticulitis. Eating a diet that does not include enough fiber from fruits and vegetables may make diverticulitis more likely to develop. SYMPTOMS  Symptoms of diverticulitis may include:  Abdominal pain and tenderness. The pain is normally located on the left  side of the abdomen, but may occur in other areas.  Fever and chills.  Bloating.  Cramping.  Nausea.  Vomiting.  Constipation.  Diarrhea.  Blood in your stool. DIAGNOSIS  Your health care provider will ask you about your medical history and do a physical exam. You may need to have tests done because many medical conditions can cause the same symptoms as diverticulitis. Tests may include:  Blood tests.  Urine tests.  Imaging tests of the abdomen, including X-rays and CT scans. When your condition is under control, your health care provider may recommend that you have a colonoscopy. A colonoscopy can show how severe your diverticula are and whether something else is causing your symptoms. TREATMENT  Most cases of diverticulitis are mild and can be treated at home. Treatment may include:  Taking over-the-counter pain medicines.  Following a clear liquid diet.  Taking antibiotic medicines by mouth for 7-10 days. More severe cases may be treated at a hospital. Treatment may include:  Not eating or drinking.  Taking prescription pain medicine.  Receiving antibiotic medicines through an IV tube.  Receiving fluids and nutrition through an IV tube.  Surgery. HOME CARE INSTRUCTIONS   Follow your health care provider's instructions carefully.  Follow a full liquid diet or other diet as directed by your health care provider. After your symptoms improve, your health care provider may tell you to change your diet. He or she may  recommend you eat a high-fiber diet. Fruits and vegetables are good sources of fiber. Fiber makes it easier to pass stool.  Take fiber supplements or probiotics as directed by your health care provider.  Only take medicines as directed by your health care provider.  Keep all your follow-up appointments. SEEK MEDICAL CARE IF:   Your pain does not improve.  You have a hard time eating food.  Your bowel movements do not return to normal. SEEK  IMMEDIATE MEDICAL CARE IF:   Your pain becomes worse.  Your symptoms do not get better.  Your symptoms suddenly get worse.  You have a fever.  You have repeated vomiting.  You have bloody or black, tarry stools. MAKE SURE YOU:   Understand these instructions.  Will watch your condition.  Will get help right away if you are not doing well or get worse.   This information is not intended to replace advice given to you by your health care provider. Make sure you discuss any questions you have with your health care provider.   Document Released: 01/03/2005 Document Revised: 03/31/2013 Document Reviewed: 02/18/2013 Elsevier Interactive Patient Education 2016 [employer]  Ciprofloxacin extended-release tablets What is this medicine? CIPROFLOXACIN (sip roe FLOX a sin) is a quinolone antibiotic. It is used to treat certain kinds of bacterial infections. It will not work for colds, flu, or other viral infections. This medicine may be used for other purposes; ask your health care provider or pharmacist if you have questions. What should I tell my health care provider before I take this medicine? They need to know if you have any of these conditions: -bone problems -cerebral disease -history of low potassium levels in the blood -irregular heartbeat -joint problems -kidney disease -myasthenia gravis -seizures -tendon problems -tingling of the fingers or toes, or other nerve disorder -an unusual or allergic reaction to ciprofloxacin, other antibiotics or medicines, foods, dyes, or preservatives -pregnant or trying to get pregnant -breast-feeding How should I use this medicine? Take this medicine by mouth with a full glass of water. Follow the directions on the prescription label. Do not split, crush, or chew the tablet. Take your medicine at regular intervals. Do not take your medicine more often than directed. Take all of your medicine as directed even if you think your are  better. Do not skip doses or stop your medicine early. You can take this medicine with food or on an empty stomach. It can be taken with a meal that contains dairy or calcium, but do not take it alone with a dairy product, like milk or yogurt, or calcium-fortified juice. A special MedGuide will be given to you by the pharmacist with each prescription and refill. Be sure to read this information carefully each time. Talk to your pediatrician regarding the use of this medicine in children. Special care may be needed. Overdosage: If you think you have taken too much of this medicine contact a poison control center or emergency room at once. NOTE: This medicine is only for you. Do not share this medicine with others. What if I miss a dose? If you miss a dose, take it as soon as you can. If it is almost time for your next dose, take only that dose. Do not take double or extra doses. Do not take more than one dose in a day. What may interact with this medicine? Do not take this medicine with any of the following medications: -cisapride -droperidol -terfenadine -tizanidine This medicine may also interact  with the following medications: -antacids -birth control pills -caffeine -cyclosporin -didanosine (ddI) buffered tablets or powder -medicines for diabetes -medicines for inflammation like ibuprofen, naproxen -methotrexate -multivitamins -omeprazole -phenytoin -probenecid -sucralfate -theophylline -warfarin This list may not describe all possible interactions. Give your health care provider a list of all the medicines, herbs, non-prescription drugs, or dietary supplements you use. Also tell them if you smoke, drink alcohol, or use illegal drugs. Some items may interact with your medicine. What should I watch for while using this medicine? Tell your doctor or health care professional if your symptoms do not improve. Do not treat diarrhea with over the counter products. Contact your doctor if  you have diarrhea that lasts more than 2 days or if it is severe and watery. You may get drowsy or dizzy. Do not drive, use machinery, or do anything that needs mental alertness until you know how this medicine affects you. Do not stand or sit up quickly, especially if you are an older patient. This reduces the risk of dizzy or fainting spells. This medicine can make you more sensitive to the sun. Keep out of the sun. If you cannot avoid being in the sun, wear protective clothing and use sunscreen. Do not use sun lamps or tanning beds/booths. Avoid antacids, aluminum, calcium, iron, magnesium, and zinc products for 6 hours before and 2 hours after taking a dose of this medicine. What side effects may I notice from receiving this medicine? Side effects that you should report to your doctor or health care professional as soon as possible: -allergic reactions like skin rash or hives, swelling of the face, lips, or tongue -anxious -confusion -depressed mood -diarrhea -fast, irregular heartbeat -hallucination, loss of contact with reality -joint, muscle, or tendon pain or swelling -pain, tingling, numbness in the hands or feet -suicidal thoughts or other mood changes -sunburn -unusually weak or tired Side effects that usually do not require medical attention (report to your doctor or health care professional if they continue or are bothersome): -dry mouth -headache -nausea -trouble sleeping This list may not describe all possible side effects. Call your doctor for medical advice about side effects. You may report side effects to FDA at 1-800-FDA-1088. Where should I keep my medicine? Keep out of the reach of children. Store at room temperature between 15 to 30 degrees C (59 to 86 degrees F). Keep container tightly closed. Throw away any unused medicine after the expiration date. NOTE: This sheet is a summary. It may not cover all possible information. If you have questions about this medicine,  talk to your doctor, pharmacist, or health care provider.    2016, Elsevier/Gold Standard. (2014-11-04 12:49:29)  Metronidazole extended-release tablets What is this medicine? METRONIDAZOLE (me troe NI da zole) is an antiinfective. It is used to treat certain kinds of bacterial and protozoal infections. It will not work for colds, flu, or other viral infections. This medicine may be used for other purposes; ask your health care provider or pharmacist if you have questions. What should I tell my health care provider before I take this medicine? They need to know if you have any of these conditions: -anemia or other blood disorders -disease of the nervous system -fungal or yeast infection -if you drink alcohol containing drinks -liver disease -seizures -an unusual or allergic reaction to metronidazole, or other medicines, foods, dyes, or preservatives -pregnant or trying to get pregnant -breast-feeding How should I use this medicine? Take this medicine by mouth with a full glass of water.  Follow the directions on the prescription label. Do not crush or chew. Take this medicine on an empty stomach 1 hour before or 2 hours after meals or food. Take your medicine at regular intervals. Do not take your medicine more often than directed. Take all of your medicine as directed even if you think you are better. Do not skip doses or stop your medicine early. Talk to your pediatrician regarding the use of this medicine in children. Special care may be needed. Overdosage: If you think you have taken too much of this medicine contact a poison control center or emergency room at once. NOTE: This medicine is only for you. Do not share this medicine with others. What if I miss a dose? If you miss a dose, take it as soon as you can. If it is almost time for your next dose, take only that dose. Do not take double or extra doses. What may interact with this medicine? Do not take this medicine with any of the  following medications: -alcohol or any product that contains alcohol -amprenavir oral solution -cisapride -disulfiram -dofetilide -dronedarone -paclitaxel injection -pimozide -ritonavir oral solution -sertraline oral solution -sulfamethoxazole-trimethoprim injection -thioridazine -ziprasidone This medicine may also interact with the following medications: -birth control pills -cimetidine -lithium -other medicines that prolong the QT interval (cause an abnormal heart rhythm) -phenobarbital -phenytoin -warfarin This list may not describe all possible interactions. Give your health care provider a list of all the medicines, herbs, non-prescription drugs, or dietary supplements you use. Also tell them if you smoke, drink alcohol, or use illegal drugs. Some items may interact with your medicine. What should I watch for while using this medicine? Tell your doctor or health care professional if your symptoms do not improve or if they get worse. You may get drowsy or dizzy. Do not drive, use machinery, or do anything that needs mental alertness until you know how this medicine affects you. Do not stand or sit up quickly, especially if you are an older patient. This reduces the risk of dizzy or fainting spells. Avoid alcoholic drinks while you are taking this medicine and for three days afterward. Alcohol may make you feel dizzy, sick, or flushed. If you are being treated for a sexually transmitted disease, avoid sexual contact until you have finished your treatment. Your sexual partner may also need treatment. What side effects may I notice from receiving this medicine? Side effects that you should report to your doctor or health care professional as soon as possible: -allergic reactions like skin rash, itching or hives, swelling of the face, lips, or tongue -confusion, clumsiness -difficulty speaking -discolored or sore mouth -dizziness -fever, infection -numbness, tingling, pain or  weakness in the hands or feet -trouble passing urine or change in the amount of urine -redness, blistering, peeling or loosening of the skin, including inside the mouth -seizures -unusually weak or tired -vaginal irritation, dryness, or discharge Side effects that usually do not require medical attention (report to your doctor or health care professional if they continue or are bothersome): -diarrhea -headache -irritability -metallic taste -nausea -stomach pain or cramps -trouble sleeping This list may not describe all possible side effects. Call your doctor for medical advice about side effects. You may report side effects to FDA at 1-800-FDA-1088. Where should I keep my medicine? Keep out of the reach of children. Store at room temperature between 15 and 30 degrees C (59 and 86 degrees F). Protect from light and moisture. Keep container tightly closed. Throw away  any unused medicine after the expiration date. NOTE: This sheet is a summary. It may not cover all possible information. If you have questions about this medicine, talk to your doctor, pharmacist, or health care provider.    2016, Elsevier/Gold Standard. (2012-10-31 14:05:10)

## 2015-04-02 NOTE — Discharge Summary (Signed)
Physician Discharge Summary  [name] [id number] DOB: 1950-11-04 DOA: 03/28/2015  PCP: Mayra Neer, MD  Admit date: 03/28/2015 Discharge date: 04/02/2015  Recommendations for Outpatient Follow-up:  Low fiber diet  Should stay on that diet for at least 3 weeks then transition to high fiber diet.  Patient to continue 10 more days of cipro and flagyl Follow up in 2-3 weeks with PCP.  Needs colonoscopy 6-8 weeks after this event  Discharge Diagnoses:  Principal Problem:   Diverticulitis of intestine with perforation without bleeding Active Problems:   Leukocytosis   GERD   Hypokalemia    Discharge Condition: stable; potassium supplemented prior to discharge   Diet recommendation: as tolerated   History of present illness:  64 y.o. female with a past medical history significant for GERD who presented to [facility] ED with abdominal pain especially in right lower quadrant, constant, sharp, 10/10 in intensity associated with diarrhea for past few days prior to this admission.  In the ED, the patient had a low-grade fever, tachycardia, and leukocytosis. A CT of the abdomen / pelvis with contrast showed scant pneumoperitoneum and possible diverticulitis. Pt started on cipro and flagyl. Surgery on board.   Hospital Course:   Assessment/Plan:    Principal Problem:  Diverticulitis of intestine with perforation without bleeding / Leukocytosis - CT abdomen on admission demonstrated small pneumoperitoneum, likely perforated sigmoid diverticulitis. - Changed to PO cipro and flagyl 12/23, tolerated well, continue for 10 more days on discharge  - Tolerates solids - Pain controlled - Surgery ok for discharge   Active Problems:  GERD - Continue Prilosec and maalox    Hypokalemia - Due to GI losses  - Supplemented  DVT Prophylaxis  - Lovenox subQ in hospital   Code Status: Full.  Family Communication: plan of care discussed with the patient   IV  access:  Peripheral IV  Procedures and diagnostic studies:   Ct Abdomen Pelvis W Contrast 03/29/2015 Small pneumoperitoneum, likely perforated sigmoid diverticulitis. Electronically Signed By: Monte Fantasia M.D. On: 03/29/2015 00:24   Medical Consultants:  Surgery  Other Consultants:  Nutrition   IAnti-Infectives:   Cipro and flagyl 03/29/2015 --> for 10 more days on discharge   Signed:  Leisa Lenz, MD  [facility] 04/02/2015, 10:07 AM  Pager #: [phone]  Time spent in minutes: more than 30 minutes   Discharge Exam: Filed Vitals:   04/01/15 2153 04/02/15 0600  BP: 150/75 129/73  Pulse: 84 83  Temp: 97.6 F (36.4 C) 98.4 F (36.9 C)  Resp: 20 18   Filed Vitals:   04/01/15 0456 04/01/15 1451 04/01/15 2153 04/02/15 0600  BP: 136/74 133/72 150/75 129/73  Pulse: 85 88 84 83  Temp: 98.5 F (36.9 C) 97.9 F (36.6 C) 97.6 F (36.4 C) 98.4 F (36.9 C)  TempSrc: Oral Oral Oral Oral  Resp: 18 18 20 18   Height:      Weight:      SpO2: 99% 99% 98% 97%    General: Pt is alert, follows commands appropriately, not in acute distress Cardiovascular: Regular rate and rhythm, S1/S2 +, no murmurs Respiratory: Clear to auscultation bilaterally, no wheezing, no crackles, no rhonchi Abdominal: Soft, non tender, non distended, bowel sounds +, no guarding Extremities: no edema, no cyanosis, pulses palpable bilaterally DP and PT Neuro: Grossly nonfocal  Discharge Instructions  Discharge Instructions    Call MD for:  difficulty breathing, headache or visual disturbances    Complete by:  As directed  Call MD for:  persistant dizziness or light-headedness    Complete by:  As directed      Call MD for:  persistant nausea and vomiting    Complete by:  As directed      Call MD for:  severe uncontrolled pain    Complete by:  As directed      Diet - low sodium heart healthy    Complete by:  As directed      Discharge instructions    Complete  by:  As directed   Low fiber diet  Should stay on that diet for at least 3 weeks then transition to high fiber diet.  Patient to continue 10 more days of cipro and flagyl Follow up in 2-3 weeks with PCP.  Needs colonoscopy 6-8 weeks after this event     Increase activity slowly    Complete by:  As directed             Medication List    TAKE these medications        acetaminophen 325 MG tablet  Commonly known as:  TYLENOL  Take 2 tablets (650 mg total) by mouth every 6 (six) hours as needed for mild pain (or Fever >/= 101).     alum & mag hydroxide-simeth 200-200-20 MG/5ML suspension  Commonly known as:  MAALOX/MYLANTA  Take 15 mLs by mouth every 4 (four) hours as needed for indigestion or heartburn.     aspirin-acetaminophen-caffeine 250-250-65 MG tablet  Commonly known as:  EXCEDRIN MIGRAINE  Take 1 tablet by mouth every 6 (six) hours as needed for headache.     CALCIUM PO  Take 1 tablet by mouth daily.     cetirizine 10 MG tablet  Commonly known as:  ZYRTEC  Take 10 mg by mouth daily as needed for allergies.     ciprofloxacin 500 MG tablet  Commonly known as:  CIPRO  Take 1 tablet (500 mg total) by mouth 2 (two) times daily.     FISH OIL PO  Take 1 tablet by mouth daily.     meloxicam 15 MG tablet  Commonly known as:  MOBIC  Take 15 mg by mouth daily.     metroNIDAZOLE 500 MG tablet  Commonly known as:  FLAGYL  Take 1 tablet (500 mg total) by mouth every 8 (eight) hours.     multivitamin with minerals Tabs tablet  Take 1 tablet by mouth daily.     omeprazole 40 MG capsule  Commonly known as:  PRILOSEC  Take 40 mg by mouth at bedtime.     SYSTANE OP  Apply 1-2 drops to eye daily as needed (dry eyes).           Follow-up Information    Follow up with Merrie Roof, MD. Schedule an appointment as soon as possible for a visit in 3 weeks.   Specialty:  General Surgery   Contact information:   [address] [place]  [address] [phone]       Follow up with SHAW,KIMBERLEE, MD. Schedule an appointment as soon as possible for a visit in 2 weeks.   Specialty:  Family Medicine   Why:  Follow up appt after recent hospitalization   Contact information:   [address]  [place] [address] [phone]        The results of significant diagnostics from this hospitalization (including imaging, microbiology, ancillary and laboratory) are listed below for reference.  Significant Diagnostic Studies: Ct Abdomen Pelvis W Contrast  03/29/2015  CLINICAL DATA:  Lower abdominal pain, bilateral, for 19 hours. EXAM: CT ABDOMEN AND PELVIS WITH CONTRAST TECHNIQUE: Multidetector CT imaging of the abdomen and pelvis was performed using the standard protocol following bolus administration of intravenous contrast. CONTRAST:  180mL OMNIPAQUE IOHEXOL 300 MG/ML SOLN, 3mL OMNIPAQUE IOHEXOL 300 MG/ML SOLN COMPARISON:  07/29/2012 FINDINGS: Lower chest and abdominal wall: Partly visualized breast reconstruction. Hepatobiliary: Innumerable low-density foci throughout the liver, stable and likely reflecting cysts or biliary hamartomas.Cholecystectomy. Pancreas: Unremarkable. Spleen: Unremarkable. Adrenals/Urinary Tract: Negative adrenals. No hydronephrosis or stone. 1 cm upper pole cyst on the left. Unremarkable bladder. Reproductive:Hysterectomy with negative adnexa. Stomach/Bowel: Few scattered bubbles of pneumoperitoneum in the low abdomen and pelvis. This is favored secondary to perforated colonic diverticulum as there is subtle inflammatory change around a sigmoid diverticulum. No primary small bowel inflammation or other focal abnormality is identified. Oral contrast reaches the transverse colon with no extravasation. No abscess or ascites. Negative appendix. Vascular/Lymphatic: No acute vascular abnormality. No mass or adenopathy. Musculoskeletal: Multilevel facet and disc degeneration. These results were called by  telephone at the time of interpretation on 03/29/2015 at 12:19 am to Dr. Theotis Burrow , who verbally acknowledged these results. IMPRESSION: Small pneumoperitoneum, likely perforated sigmoid diverticulitis. Electronically Signed   By: Monte Fantasia M.D.   On: 03/29/2015 00:24    Microbiology: No results found for this or any previous visit (from the past 240 hour(s)).   Labs: Basic Metabolic Panel:  Recent Labs Lab 03/28/15 2119 03/29/15 0656 03/30/15 0534 03/31/15 0505 04/01/15 0520  NA 139  --  144 142 143  K 3.6  --  3.2* 3.1* 3.2*  CL 107  --  112* 113* 111  CO2 25  --  22 21* 22  GLUCOSE 121*  --  92 75 101*  BUN 13  --  10 11 8   CREATININE 0.74 0.69 0.75 0.65 0.68  CALCIUM 9.1  --  8.4* 8.2* 8.1*   Liver Function Tests:  Recent Labs Lab 03/28/15 2119  AST 27  ALT 24  ALKPHOS 66  BILITOT 1.1  PROT 7.5  ALBUMIN 3.8    Recent Labs Lab 03/28/15 2119  LIPASE 18   No results for input(s): AMMONIA in the last 168 hours. CBC:  Recent Labs Lab 03/28/15 2119 03/29/15 0656 03/30/15 0534 03/31/15 0505 04/01/15 0520  WBC 17.2* 12.4* 13.5* 11.1* 9.6  HGB 14.1 14.2 12.9 11.9* 12.1  HCT 41.8 41.7 39.5 35.3* 35.2*  MCV 96.5 98.8 100.5* 97.2 95.1  PLT 277 175 219 225 256   Cardiac Enzymes: No results for input(s): CKTOTAL, CKMB, CKMBINDEX, TROPONINI in the last 168 hours. BNP: BNP (last 3 results) No results for input(s): BNP in the last 8760 hours.  ProBNP (last 3 results) No results for input(s): PROBNP in the last 8760 hours.  CBG: No results for input(s): GLUCAP in the last 168 hours.

## 2015-04-29 ENCOUNTER — Encounter: Payer: Self-pay | Admitting: Internal Medicine

## 2015-05-17 ENCOUNTER — Ambulatory Visit (INDEPENDENT_AMBULATORY_CARE_PROVIDER_SITE_OTHER): Payer: BLUE CROSS/BLUE SHIELD | Admitting: Physician Assistant

## 2015-05-17 ENCOUNTER — Encounter: Payer: Self-pay | Admitting: Physician Assistant

## 2015-05-17 VITALS — BP 122/74 | HR 78 | Ht 62.0 in | Wt 180.0 lb

## 2015-05-17 DIAGNOSIS — K5732 Diverticulitis of large intestine without perforation or abscess without bleeding: Secondary | ICD-10-CM

## 2015-05-17 DIAGNOSIS — Z86010 Personal history of colonic polyps: Secondary | ICD-10-CM

## 2015-05-17 MED ORDER — NA SULFATE-K SULFATE-MG SULF 17.5-3.13-1.6 GM/177ML PO SOLN: 1.0000 | Freq: Once | ORAL | Status: AC

## 2015-05-17 NOTE — Progress Notes (Addendum)
Patient ID: Shannon [name], female   DOB: 1950/06/18, 65 y.o.   MRN: [id number]   Subjective:    Patient ID: Shannon [name], female    DOB: 11/09/1950, 65 y.o.   MRN: [id number]  HPI  Shannon [name]  is a pleasant 65 year old white female previously known to Dr. Delfin Edis from colonoscopy. She comes in today in follow-up post hospital after an episode of acute diverticulitis. Patient had undergone colonoscopy in April 2011, she was found to have 4 diminutive polyps, path consistent with hyperplastic polyps and was also noted to have moderate diverticulosis throughout the colon.   Patient was hospitalized 1220 through 04/02/2015 with acute sigmoid diverticulitis with microperforation. She was managed by the hospitalist and surgeons and was not seen by GI during this admission. CT scan of the abdomen and pelvis done on 03/28/2015 showed a few scattered bubbles of new of pneumoperitoneum in the low abdomen and pelvis and subtle inflammatory changes around a sigmoid colon diverticulum. WBC was 17.2 on admission. She was treated with IV antibiotics and then discharged home to complete a course of Cipro and Flagyl. Patient states she is much better at this point and has no ongoing abdominal discomfort. She was told to stay on a low fiber diet which she has been doing. She says she's been somewhat constipated with the low fiber. She is having bowel movements and has not noticed any blood. She has had no fever or chills sweats etc. This  was her first episode of diverticulitis, she says the pain was not intensive onset but rapidly progressed within several hours until she was forced to go to the emergency room. Patient does have prior history of breast cancer and is status post cholecystectomy.  Review of Systems Pertinent positive and negative review of systems were noted in the above HPI section.  All other review of systems was otherwise negative.  Outpatient Encounter Prescriptions as of 05/17/2015    Medication Sig  . alum & mag hydroxide-simeth (MAALOX/MYLANTA) 200-200-20 MG/5ML suspension Take 15 mLs by mouth every 4 (four) hours as needed for indigestion or heartburn.  Marland Kitchen CALCIUM PO Take 1 tablet by mouth daily.  . cetirizine (ZYRTEC) 10 MG tablet Take 10 mg by mouth daily as needed for allergies.  . meloxicam (MOBIC) 15 MG tablet Take 15 mg by mouth daily.  . Multiple Vitamin (MULTIVITAMIN WITH MINERALS) TABS tablet Take 1 tablet by mouth daily.  . Omega-3 Fatty Acids (FISH OIL PO) Take 1 tablet by mouth daily.  Marland Kitchen omeprazole (PRILOSEC) 40 MG capsule Take 40 mg by mouth at bedtime.  Vladimir Faster Glycol-Propyl Glycol (SYSTANE OP) Apply 1-2 drops to eye daily as needed (dry eyes).  Marland Kitchen aspirin-acetaminophen-caffeine (EXCEDRIN MIGRAINE) 250-250-65 MG tablet Take 1 tablet by mouth every 6 (six) hours as needed for headache.  . Na Sulfate-K Sulfate-Mg Sulf SOLN Take 1 kit by mouth once.  . [DISCONTINUED] acetaminophen (TYLENOL) 325 MG tablet Take 2 tablets (650 mg total) by mouth every 6 (six) hours as needed for mild pain (or Fever >/= 101). (Patient not taking: Reported on 05/17/2015)  . [DISCONTINUED] ciprofloxacin (CIPRO) 500 MG tablet Take 1 tablet (500 mg total) by mouth 2 (two) times daily. (Patient not taking: Reported on 05/17/2015)  . [DISCONTINUED] metroNIDAZOLE (FLAGYL) 500 MG tablet Take 1 tablet (500 mg total) by mouth every 8 (eight) hours. (Patient not taking: Reported on 05/17/2015)   No facility-administered encounter medications on file as of 05/17/2015.   Allergies  Allergen Reactions  .  Codeine Nausea Only   Patient Active Problem List   Diagnosis Date Noted  . Leukocytosis 03/30/2015  . Hypokalemia 03/30/2015  . Diverticulitis of intestine with perforation without bleeding 03/29/2015  . GERD 03/25/2008   Social History   Social History  . Marital Status: Divorced    Spouse Name: N/A  . Number of Children: N/A  . Years of Education: N/A   Occupational History  .  retired    Social History Main Topics  . Smoking status: Former Smoker    Quit date: 04/09/2005  . Smokeless tobacco: Never Used  . Alcohol Use: No  . Drug Use: No  . Sexual Activity: Not on file   Other Topics Concern  . Not on file   Social History Narrative    Ms. Storie's family history includes Dementia in her mother; Diverticulitis in her paternal grandmother; Pancreatic cancer in her father; Skin cancer in her father.      Objective:    Filed Vitals:   05/17/15 1013  BP: 122/74  Pulse: 78    Physical Exam  well-developed older white female in no acute distress, pleasant blood pressure 122/74 pulse 78 height 5 foot 2 weight 180. HEENT ;nontraumatic, cephalic EOMI PERRLA sclera anicteric, Cardiovascular; regular rate and rhythm with S1-S2 no murmur rub or gallop, Pulmonary; clear bilaterally, Abdomen; soft nontender nondistended bowel sounds are active there is no palpable mass or hepatosplenomegaly, Rectal ;exam not done, Ext; no clubbing cyanosis or edema skin warm and dry, Neuropsych ;mood and affect appropriate     Assessment & Plan:   #1 65 yo WF with recent hospitalization for acute sigmoid  diverticulitis with microperforation 03/2015 Patient is asymptomatic currently, this was her first episode of diverticulitis. #2 history of hyperplastic colon polyps on colonoscopy 2011 #3 moderate pandiverticulosis #4 status post cholecystectomy #5 history of breast cancer  Plan; we will advance gradually to high-fiber diet Add Benefiber once daily Avoid popcorn and nuts Will schedule for colonoscopy with Dr. Merita Norton discussed in detail with the patient including risks and benefits and she is agreeable to proceed.   Addendum-  Error -pt was scheduled with Dr Loletha Carrow for Colonoscopy,and will be established with Dr Loletha Carrow. Will forward chart to  Dr Loletha Carrow.    Nate Common S Enrica Corliss PA-C 05/17/2015   Cc: Mayra Neer, MD

## 2015-05-17 NOTE — Patient Instructions (Signed)
Drink plenty of fluids. We sent a prescription to [facility], [address] For the [employer] for the colonoscopy. Take Benefiber in a glass of water or Juice daily.  High fiber diet provided daily.  You have been scheduled for a colonoscopy. Please follow written instructions given to you at your visit today.  Please pick up your prep supplies at the pharmacy within the next 1-3 days. If you use inhalers (even only as needed), please bring them with you on the day of your procedure. Your physician has requested that you go to www.startemmi.com and enter the access code given to you at your visit today. This web site gives a general overview about your procedure. However, you should still follow specific instructions given to you by our office regarding your preparation for the procedure.

## 2015-05-18 NOTE — Progress Notes (Signed)
Reviewed and agree with management plan.  Darnita Woodrum T. Germani Gavilanes, MD FACG 

## 2015-05-19 ENCOUNTER — Telehealth: Payer: Self-pay | Admitting: *Deleted

## 2015-05-19 NOTE — Telephone Encounter (Signed)
Advised the patient I do have a sample prep at the front desk for her. Her insurance would not cover the [place].

## 2015-05-19 NOTE — Telephone Encounter (Signed)
LM for the patient asking her to let me know if she picked up a prep from [place].  [facility] faxed a prior auth request.

## 2015-06-16 ENCOUNTER — Telehealth: Payer: Self-pay | Admitting: Gastroenterology

## 2015-06-16 NOTE — Telephone Encounter (Signed)
Pt had a fever off/on yesterday, she is not sure what the temp was.  Pt has no fever or any symptoms today, she was advised to keep appt as scheduled but call if any symptoms return

## 2015-06-17 ENCOUNTER — Encounter: Payer: Self-pay | Admitting: Gastroenterology

## 2015-06-17 ENCOUNTER — Ambulatory Visit (AMBULATORY_SURGERY_CENTER): Payer: BLUE CROSS/BLUE SHIELD | Admitting: Gastroenterology

## 2015-06-17 VITALS — BP 107/59 | HR 73 | Temp 97.1°F | Resp 12 | Ht 62.0 in | Wt 180.0 lb

## 2015-06-17 DIAGNOSIS — K635 Polyp of colon: Secondary | ICD-10-CM | POA: Diagnosis not present

## 2015-06-17 DIAGNOSIS — K5732 Diverticulitis of large intestine without perforation or abscess without bleeding: Secondary | ICD-10-CM

## 2015-06-17 DIAGNOSIS — D124 Benign neoplasm of descending colon: Secondary | ICD-10-CM

## 2015-06-17 MED ORDER — SODIUM CHLORIDE 0.9 % IV SOLN: 500.0000 mL | INTRAVENOUS | Status: DC

## 2015-06-17 NOTE — Progress Notes (Signed)
Called to room to assist during endoscopic procedure.  Patient ID and intended procedure confirmed with present staff. Received instructions for my participation in the procedure from the performing physician.  

## 2015-06-17 NOTE — Op Note (Signed)
[facility] Patient Name: Shannon [name] Procedure Date: 06/17/2015 11:29 AM MRN: [id number] Endoscopist: [place]. Loletha Carrow , MD Age: 65 Referring MD:  Date of Birth: May 21, 1950 Gender: Female Procedure:                Colonoscopy Indications:              Follow-up of diverticulitis Medicines:                Monitored Anesthesia Care Procedure:                Pre-Anesthesia Assessment:                           - Prior to the procedure, a History and Physical                            was performed, and patient medications and                            allergies were reviewed. The patient's tolerance of                            previous anesthesia was also reviewed. The risks                            and benefits of the procedure and the sedation                            options and risks were discussed with the patient.                            All questions were answered, and informed consent                            was obtained. Prior Anticoagulants: The patient has                            taken no previous anticoagulant or antiplatelet                            agents. ASA Grade Assessment: II - A patient with                            mild systemic disease. After reviewing the risks                            and benefits, the patient was deemed in                            satisfactory condition to undergo the procedure.                           After obtaining informed consent, the colonoscope  was passed under direct vision. Throughout the                            procedure, the patient's blood pressure, pulse, and                            oxygen saturations were monitored continuously. The                            Model PCF-H190L [id number]) scope was introduced                            through the anus and advanced to the the cecum,                            identified by appendiceal orifice and ileocecal                             valve. The colonoscopy was performed without                            difficulty. The patient tolerated the procedure                            well. The quality of the bowel preparation was                            good. The ileocecal valve, appendiceal orifice, and                            rectum were photographed. The quality of the bowel                            preparation was evaluated using the BBPS [facility]                            Bowel Preparation Scale) with scores of: Right                            Colon = 2, Transverse Colon = 2 and Left Colon = 2.                            The total BBPS score equals 6. The bowel                            preparation used was Miralax. Scope In: 11:40:24 AM Scope Out: 11:55:47 AM Scope Withdrawal Time: 0 hours 10 minutes 22 seconds  Total Procedure Duration: 0 hours 15 minutes 23 seconds  Findings:      The perianal and digital rectal examinations were normal.      A 2 mm polyp was found in the mid descending colon. The polyp was       sessile. The polyp was removed with a cold biopsy forceps. Resection and  retrieval were complete.      Multiple small-mouthed diverticula were found in the sigmoid colon.      The exam was otherwise without abnormality on direct and retroflexion       views. Complications:            No immediate complications. Estimated Blood Loss:     Estimated blood loss: none. Impression:               - One 2 mm polyp in the mid descending colon,                            removed with a cold biopsy forceps. Resected and                            retrieved.                           - Diverticulosis in the sigmoid colon.                           - The examination was otherwise normal on direct                            and retroflexion views. Recommendation:           - Patient has a contact number available for                            emergencies. The signs and  symptoms of potential                            delayed complications were discussed with the                            patient. Return to normal activities tomorrow.                            Written discharge instructions were provided to the                            patient.                           - Resume previous diet.                           - No aspirin, ibuprofen, naproxen, or other                            non-steroidal anti-inflammatory drugs for 5 days                            after polyp removal.                           - Repeat colonoscopy is recommended for  surveillance. The colonoscopy date will be                            determined after pathology results from today's                            exam become available for review.                           - Return to my office PRN. Procedure Code(s):        --- Professional ---                           [id number], Colonoscopy, flexible; with biopsy, single                            or multiple CPT copyright 2016 American Medical Association. All rights reserved. Henry L. Loletha Carrow, MD Estill Cotta Danis, MD 06/17/2015 12:02:10 PM Number of Addenda: 0

## 2015-06-17 NOTE — Progress Notes (Signed)
Report to PACU, RN, vss, BBS= Clear.  

## 2015-06-17 NOTE — Patient Instructions (Signed)
YOU HAD AN ENDOSCOPIC PROCEDURE TODAY AT [facility]:   Refer to the procedure report that was given to you for any specific questions about what was found during the examination.  If the procedure report does not answer your questions, please call your gastroenterologist to clarify.  If you requested that your care partner not be given the details of your procedure findings, then the procedure report has been included in a sealed envelope for you to review at your convenience later.  YOU SHOULD EXPECT: Some feelings of bloating in the abdomen. Passage of more gas than usual.  Walking can help get rid of the air that was put into your GI tract during the procedure and reduce the bloating. If you had a lower endoscopy (such as a colonoscopy or flexible sigmoidoscopy) you may notice spotting of blood in your stool or on the toilet paper. If you underwent a bowel prep for your procedure, you may not have a normal bowel movement for a few days.  Please Note:  You might notice some irritation and congestion in your nose or some drainage.  This is from the oxygen used during your procedure.  There is no need for concern and it should clear up in a day or so.  SYMPTOMS TO REPORT IMMEDIATELY:   Following lower endoscopy (colonoscopy or flexible sigmoidoscopy):  Excessive amounts of blood in the stool  Significant tenderness or worsening of abdominal pains  Swelling of the abdomen that is new, acute  Fever of 100F or higher    For urgent or emergent issues, a gastroenterologist can be reached at any hour by calling [phone].   DIET: Your first meal following the procedure should be a small meal and then it is ok to progress to your normal diet. Heavy or fried foods are harder to digest and may make you feel nauseous or bloated.  Likewise, meals heavy in dairy and vegetables can increase bloating.  Drink plenty of fluids but you should avoid alcoholic beverages for 24  hours.  ACTIVITY:  You should plan to take it easy for the rest of today and you should NOT DRIVE or use heavy machinery until tomorrow (because of the sedation medicines used during the test).    FOLLOW UP: Our staff will call the number listed on your records the next business day following your procedure to check on you and address any questions or concerns that you may have regarding the information given to you following your procedure. If we do not reach you, we will leave a message.  However, if you are feeling well and you are not experiencing any problems, there is no need to return our call.  We will assume that you have returned to your regular daily activities without incident.  If any biopsies were taken you will be contacted by phone or by letter within the next 1-3 weeks.  Please call us at [phone] if you have not heard about the biopsies in 3 weeks.    SIGNATURES/CONFIDENTIALITY: You and/or your care partner have signed paperwork which will be entered into your electronic medical record.  These signatures attest to the fact that that the information above on your After Visit Summary has been reviewed and is understood.  Full responsibility of the confidentiality of this discharge information lies with you and/or your care-partner.   Hold Aspirin and Nsaids for 5 Days,may resume remainder of medications. Information given on polyps, diverticulosis and high fiber diet.

## 2015-06-20 ENCOUNTER — Telehealth: Payer: Self-pay

## 2015-06-20 NOTE — Telephone Encounter (Signed)
  Follow up Call-  Call back number 06/17/2015  Post procedure Call Back phone  # [phone]  Permission to leave phone message Yes     Patient questions:  Do you have a fever, pain , or abdominal swelling? No. Pain Score  0 *  Have you tolerated food without any problems? Yes.    Have you been able to return to your normal activities? Yes.    Do you have any questions about your discharge instructions: Diet   No. Medications  No. Follow up visit  No.  Do you have questions or concerns about your Care? No.  Actions: * If pain score is 4 or above: No action needed, pain <4.

## 2015-06-23 ENCOUNTER — Encounter: Payer: Self-pay | Admitting: Gastroenterology

## 2015-07-04 ENCOUNTER — Telehealth: Payer: Self-pay | Admitting: Internal Medicine

## 2015-07-04 MED ORDER — AMOXICILLIN-POT CLAVULANATE 875-125 MG PO TABS: 1.0000 | ORAL_TABLET | Freq: Two times a day (BID) | ORAL | Status: DC

## 2015-07-04 NOTE — Telephone Encounter (Signed)
Patient called earlier and c/o lower abdominal pain x few days and now a vere 100.6 - said dimilar to previous diverticulitis. Had CT proven diverticulitis 03/2015 Recent colonoscopy Dr. Loletha Carrow 3/10 - 2 mm cold forceps removal and diverticulosis  Advised liquid-soft diet Augmentin 875 mg bid x 10 d We will call tomorrow w/ f/u ? And make plans for f/u visit  If worse tonight go to ED

## 2015-07-05 NOTE — Telephone Encounter (Signed)
Patty,    Please call he to make sure she has started on antibiotics for diverticulitis, as ordered by Dr Carlean Purl last night.  It is a 14 day course, and I would like to see her 1-2 weeks after completing it, so 3-4 weeks from now.Call sooner if needed.

## 2015-07-05 NOTE — Telephone Encounter (Signed)
Patient states she is taking antibiotics. Scheduled OV on 08/08/15 at 2:45 PM with Dr. Loletha Carrow.

## 2015-07-05 NOTE — Telephone Encounter (Signed)
Left a message for patient to call back. 

## 2015-07-06 ENCOUNTER — Telehealth: Payer: Self-pay | Admitting: Gastroenterology

## 2015-07-06 ENCOUNTER — Other Ambulatory Visit (HOSPITAL_COMMUNITY): Payer: Self-pay | Admitting: General Surgery

## 2015-07-06 ENCOUNTER — Encounter (HOSPITAL_COMMUNITY): Payer: Self-pay

## 2015-07-06 ENCOUNTER — Inpatient Hospital Stay (HOSPITAL_COMMUNITY)
Admission: AD | Admit: 2015-07-06 | Discharge: 2015-07-15 | DRG: 330 | Disposition: A | Discharge status: Home Health | Payer: BLUE CROSS/BLUE SHIELD | Source: Ambulatory Visit | Attending: General Surgery | Admitting: General Surgery

## 2015-07-06 ENCOUNTER — Ambulatory Visit: Payer: Self-pay | Admitting: Surgery

## 2015-07-06 VITALS — BP 154/68 | HR 85 | Temp 98.2°F | Resp 17 | Ht 62.0 in | Wt 205.6 lb

## 2015-07-06 DIAGNOSIS — Z9010 Acquired absence of unspecified breast and nipple: Secondary | ICD-10-CM | POA: Diagnosis not present

## 2015-07-06 DIAGNOSIS — K5720 Diverticulitis of large intestine with perforation and abscess without bleeding: Secondary | ICD-10-CM | POA: Diagnosis present

## 2015-07-06 DIAGNOSIS — K5732 Diverticulitis of large intestine without perforation or abscess without bleeding: Secondary | ICD-10-CM | POA: Diagnosis present

## 2015-07-06 DIAGNOSIS — K567 Ileus, unspecified: Secondary | ICD-10-CM | POA: Diagnosis not present

## 2015-07-06 DIAGNOSIS — Z853 Personal history of malignant neoplasm of breast: Secondary | ICD-10-CM | POA: Diagnosis not present

## 2015-07-06 DIAGNOSIS — Z87891 Personal history of nicotine dependence: Secondary | ICD-10-CM

## 2015-07-06 DIAGNOSIS — K219 Gastro-esophageal reflux disease without esophagitis: Secondary | ICD-10-CM | POA: Diagnosis present

## 2015-07-06 DIAGNOSIS — Z791 Long term (current) use of non-steroidal anti-inflammatories (NSAID): Secondary | ICD-10-CM | POA: Diagnosis not present

## 2015-07-06 LAB — CBC
HCT: 42.5 % (ref 36.0–46.0)
Hemoglobin: 15.2 g/dL — ABNORMAL HIGH (ref 12.0–15.0)
MCH: 33.9 pg (ref 26.0–34.0)
MCHC: 35.8 g/dL (ref 30.0–36.0)
MCV: 94.7 fL (ref 78.0–100.0)
PLATELETS: 320 10*3/uL (ref 150–400)
RBC: 4.49 MIL/uL (ref 3.87–5.11)
RDW: 13.4 % (ref 11.5–15.5)
WBC: 14.7 10*3/uL — AB (ref 4.0–10.5)

## 2015-07-06 LAB — COMPREHENSIVE METABOLIC PANEL
ALBUMIN: 3.3 g/dL — AB (ref 3.5–5.0)
ALT: 22 U/L (ref 14–54)
ANION GAP: 12 (ref 5–15)
AST: 21 U/L (ref 15–41)
Alkaline Phosphatase: 91 U/L (ref 38–126)
BUN: 10 mg/dL (ref 6–20)
CHLORIDE: 101 mmol/L (ref 101–111)
CO2: 21 mmol/L — AB (ref 22–32)
Calcium: 9.7 mg/dL (ref 8.9–10.3)
Creatinine, Ser: 0.9 mg/dL (ref 0.44–1.00)
GFR calc Af Amer: >60 mL/min (ref >60)
GFR calc non Af Amer: >60 mL/min (ref >60)
GLUCOSE: 108 mg/dL — AB (ref 65–99)
POTASSIUM: 3.6 mmol/L (ref 3.5–5.1)
SODIUM: 134 mmol/L — AB (ref 135–145)
TOTAL PROTEIN: 8.5 g/dL — AB (ref 6.5–8.1)
Total Bilirubin: 1 mg/dL (ref 0.3–1.2)

## 2015-07-06 LAB — PROTIME-INR
INR: 1.15 (ref 0.00–1.49)
Prothrombin Time: 14.9 seconds (ref 11.6–15.2)

## 2015-07-06 LAB — POCT I-STAT CREATININE: CREATININE: 0.9 mg/dL (ref 0.44–1.00)

## 2015-07-06 MED ORDER — HYDRALAZINE HCL 20 MG/ML IJ SOLN: 10.0000 mg | INTRAMUSCULAR | Status: DC | PRN

## 2015-07-06 MED ORDER — IOPAMIDOL (ISOVUE-300) INJECTION 61%
INTRAVENOUS | Status: AC
  Filled 2015-07-06: qty 100

## 2015-07-06 MED ORDER — HYDROMORPHONE HCL 1 MG/ML IJ SOLN
1.0000 mg | INTRAMUSCULAR | Status: DC | PRN
  Administered 2015-07-06 – 2015-07-07 (×2): 1 mg via INTRAVENOUS
  Filled 2015-07-06 (×2): qty 1

## 2015-07-06 MED ORDER — DIPHENHYDRAMINE HCL 12.5 MG/5ML PO ELIX: 12.5000 mg | ORAL_SOLUTION | Freq: Four times a day (QID) | ORAL | Status: DC | PRN

## 2015-07-06 MED ORDER — DIPHENHYDRAMINE HCL 50 MG/ML IJ SOLN: 12.5000 mg | Freq: Four times a day (QID) | INTRAMUSCULAR | Status: DC | PRN

## 2015-07-06 MED ORDER — ONDANSETRON HCL 4 MG/2ML IJ SOLN
4.0000 mg | Freq: Four times a day (QID) | INTRAMUSCULAR | Status: DC | PRN
  Administered 2015-07-06 – 2015-07-07 (×2): 4 mg via INTRAVENOUS
  Filled 2015-07-06 (×2): qty 2

## 2015-07-06 MED ORDER — ENOXAPARIN SODIUM 40 MG/0.4ML ~~LOC~~ SOLN
40.0000 mg | SUBCUTANEOUS | Status: DC
  Administered 2015-07-06 – 2015-07-14 (×9): 40 mg via SUBCUTANEOUS
  Filled 2015-07-06 (×9): qty 0.4

## 2015-07-06 MED ORDER — PIPERACILLIN-TAZOBACTAM 3.375 G IVPB
3.3750 g | Freq: Three times a day (TID) | INTRAVENOUS | Status: DC
  Administered 2015-07-06 – 2015-07-14 (×22): 3.375 g via INTRAVENOUS
  Filled 2015-07-06 (×26): qty 50

## 2015-07-06 MED ORDER — ONDANSETRON 4 MG PO TBDP: 4.0000 mg | ORAL_TABLET | Freq: Four times a day (QID) | ORAL | Status: DC | PRN

## 2015-07-06 MED ORDER — POTASSIUM CHLORIDE IN NACL 20-0.9 MEQ/L-% IV SOLN
INTRAVENOUS | Status: DC
  Administered 2015-07-06 – 2015-07-11 (×9): via INTRAVENOUS
  Filled 2015-07-06 (×12): qty 1000

## 2015-07-06 NOTE — Telephone Encounter (Signed)
Left message on machine to call back  

## 2015-07-06 NOTE — H&P (Signed)
Shannon [name] is an 65 y.o. female.    GI - Dr. Loletha Carrow  Chief Complaint: Worsening abdominal pain  HPI: This is a 65 yo female with a history of breast cancer who was admitted at [facility] 03/29/15 for sigmoid diverticulitis with microperforation.  She recovered and was discharged 04/02/15 on antibiotics.  She had been doing well and underwent colonoscopy on 06/17/15 by Dr. Loletha Carrow which showed only some small-mouthed diverticula.  Over the last several days, she has had worsening lower abdominal pain that has spread upwards in her abdomen.  She was started on Augmentin two days ago by GI.  Her pain worsened so she called our office.  CT scan was ordered and she is now being directly admitted.  Past Medical History  Diagnosis Date  . Diverticulitis   . Arthritis   . Breast cancer ([place])     1991right breast  . Colon polyps   . GERD (gastroesophageal reflux disease)   . Gallstones   . Obesity   . Allergy     Past Surgical History  Procedure Laterality Date  . Abdominal hysterectomy    . Tubal ligation    . Cholecystectomy    . Mastectomy Right     with bilateral implants    Family History  Problem Relation Age of Onset  . Dementia Mother   . Skin cancer Father   . Pancreatic cancer Father   . Diverticulitis Paternal Grandmother   . Gastric cancer Maternal Grandmother    Social History:  reports that she quit smoking about 10 years ago. She has never used smokeless tobacco. She reports that she does not drink alcohol or use illicit drugs.  Allergies:  Allergies  Allergen Reactions  . Codeine Nausea Only  . Morphine And Related Anxiety    Taking over body felt like i was gonna die    Medications Prior to Admission  Medication Sig Dispense Refill  . alum & mag hydroxide-simeth (MAALOX/MYLANTA) 200-200-20 MG/5ML suspension Take 15 mLs by mouth every 4 (four) hours as needed for indigestion or heartburn. 355 mL 0  . amoxicillin-clavulanate (AUGMENTIN) 875-125 MG tablet Take 1  tablet by mouth 2 (two) times daily. 20 tablet 0  . aspirin-acetaminophen-caffeine (EXCEDRIN MIGRAINE) 250-250-65 MG tablet Take 1 tablet by mouth every 6 (six) hours as needed for headache.    Marland Kitchen CALCIUM PO Take 1 tablet by mouth daily.    . cetirizine (ZYRTEC) 10 MG tablet Take 10 mg by mouth daily as needed for allergies.    . meloxicam (MOBIC) 15 MG tablet Take 15 mg by mouth daily.    . Multiple Vitamin (MULTIVITAMIN WITH MINERALS) TABS tablet Take 1 tablet by mouth daily.    . Omega-3 Fatty Acids (FISH OIL PO) Take 1 tablet by mouth daily.    Marland Kitchen omeprazole (PRILOSEC) 40 MG capsule Take 40 mg by mouth at bedtime.    Vladimir Faster Glycol-Propyl Glycol (SYSTANE OP) Apply 1-2 drops to eye daily as needed (dry eyes).      Results for orders placed or performed during the hospital encounter of 07/06/15 (from the past 48 hour(s))  I-STAT creatinine     Status: None   Collection Time: 07/06/15  3:56 PM  Result Value Ref Range   Creatinine, Ser 0.90 0.44 - 1.00 mg/dL   Ct Abdomen Pelvis W Contrast  07/06/2015  CLINICAL DATA:  Rectosigmoid colon diverticulitis EXAM: CT ABDOMEN AND PELVIS WITH CONTRAST TECHNIQUE: Multidetector CT imaging of the abdomen and pelvis was performed  using the standard protocol following bolus administration of intravenous contrast. CONTRAST:  100 cc Isovue COMPARISON:  03/29/2015 FINDINGS: Lower chest: Lung bases shows mild posterior atelectasis. Bilateral partially visualized breast implants. Hepatobiliary: Stable small low-density cysts or biliary hamartomas within liver. No solid hepatic mass. The patient is status postcholecystectomy. Pancreas: No mass, inflammatory changes, or other significant abnormality. Spleen: Within normal limits in size and appearance. Adrenals/Urinary Tract: No adrenal gland mass. Enhanced kidneys are symmetrical in size. No hydronephrosis or hydroureter. The urinary bladder is under distended. No calcified calculi are noted within urinary bladder.  Delayed renal images shows bilateral renal symmetrical excretion. There is a cyst in midpole of the left kidney measures 1.5 cm. Stomach/Bowel: Small free air is noted in axial image 1 anterior and lateral to the liver consistent with perforated viscus. There are dilated small bowel loops in left upper abdomen and left lower abdomen. There is transition point in caliber of small bowel in mid pelvis in axial image 57. Findings are consistent with partial small bowel obstruction or significant ileus. There is thickening of the wall of small bowel loops in mid pelvis please see axial image 61. There is a mesenteric abscess adjacent to small bowel loops and axial image 62 measures 3.2 by 3.3 cm. The sigmoid colon is decompressed. There is mild stranding of the pericolonic fat. Findings probable consistent with perforated diverticulitis. Axial image 66 there is a tubular tract between anterior or sigmoid colon and the abscess in mid pelvis. Findings highly suspicious for a fistulous tract. Small collection just adjacent to sigmoid colon within pelvis axial image 67 measures 1.4 cm. Some fluid noted in right colon. The descending colon is decompressed. Some fluid noted within a decompressed descending colon. Axial image 62 normal appendix is partially visualized. Vascular/Lymphatic: No significant retroperitoneal or mesenteric adenopathy. No aortic aneurysm. Reproductive: The patient is status post hysterectomy. Other: No inguinal adenopathy. Musculoskeletal: Sagittal images of the spine shows degenerative changes lumbar spine. IMPRESSION: 1. There is small amount of free abdominal air adjacent and anterior to the liver consistent with perforated viscus. Distended small bowel loops are noted in left mid and lower abdomen. There is transition point in caliber of small bowel and mild thickening of distal small bowel loops. Findings highly suspicious for significant ileus or partial small bowel obstruction. Small amount of  free fluid adjacent to distal small bowel loops. There is a collection adjacent to cluster small bowel loops in mid pelvis measures 3.3 x 3.2 cm highly suspicious for mesenteric abscess. Small amount of extraluminal air see axial image 59. There is a tubular connection with a small collection adjacent to sigmoid colon. Findings consistent with perforated sigmoid diverticulitis with small pericolonic abscess and fistulous tract. 2. The distal colon is empty decompressed. 3. No hydronephrosis or hydroureter. 4. Status post hysterectomy. These results were called by telephone at the time of interpretation on 07/06/2015 at 6:43 pm to Dr. Jimmy Footman, who verbally acknowledged these results. Electronically Signed   By: Lahoma Crocker M.D.   On: 07/06/2015 18:45    Review of Systems  Constitutional: Negative for weight loss.  HENT: Negative for ear discharge, ear pain, hearing loss and tinnitus.   Eyes: Negative for blurred vision, double vision, photophobia and pain.  Respiratory: Negative for cough, sputum production and shortness of breath.   Cardiovascular: Negative for chest pain.  Gastrointestinal: Positive for nausea and abdominal pain. Negative for vomiting.  Genitourinary: Negative for dysuria, urgency, frequency and flank pain.  Musculoskeletal: Negative for  myalgias, back pain, joint pain, falls and neck pain.  Neurological: Negative for dizziness, tingling, sensory change, focal weakness, loss of consciousness and headaches.  Endo/Heme/Allergies: Does not bruise/bleed easily.  Psychiatric/Behavioral: Negative for depression, memory loss and substance abuse. The patient is not nervous/anxious.     There were no vitals taken for this visit. 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  We discussed Hartmann's procedure with temporary colostomy.  She understands and wishes to proceed with this plan.  Maia Petties., MD 07/06/2015, 7:24 PM

## 2015-07-07 ENCOUNTER — Inpatient Hospital Stay (HOSPITAL_COMMUNITY): Payer: BLUE CROSS/BLUE SHIELD | Admitting: Anesthesiology

## 2015-07-07 ENCOUNTER — Encounter (HOSPITAL_COMMUNITY): Payer: Self-pay

## 2015-07-07 ENCOUNTER — Encounter (HOSPITAL_COMMUNITY): Admission: AD | Disposition: A | Discharge status: Home Health | Payer: Self-pay | Source: Ambulatory Visit

## 2015-07-07 HISTORY — PX: COLECTOMY WITH COLOSTOMY CREATION/HARTMANN PROCEDURE: SHX6598

## 2015-07-07 HISTORY — PX: COLOSTOMY: SHX63

## 2015-07-07 LAB — SURGICAL PCR SCREEN
MRSA, PCR: POSITIVE — AB
STAPHYLOCOCCUS AUREUS: POSITIVE — AB

## 2015-07-07 LAB — GLUCOSE, CAPILLARY: Glucose-Capillary: 109 mg/dL — ABNORMAL HIGH (ref 65–99)

## 2015-07-07 SURGERY — COLECTOMY, WITH COLOSTOMY CREATION
Anesthesia: General | Site: Abdomen

## 2015-07-07 MED ORDER — ONDANSETRON HCL 4 MG/2ML IJ SOLN
INTRAMUSCULAR | Status: AC
  Filled 2015-07-07: qty 2

## 2015-07-07 MED ORDER — FENTANYL CITRATE (PF) 250 MCG/5ML IJ SOLN
INTRAMUSCULAR | Status: AC
  Filled 2015-07-07: qty 5

## 2015-07-07 MED ORDER — LIDOCAINE HCL (CARDIAC) 20 MG/ML IV SOLN
INTRAVENOUS | Status: AC
  Filled 2015-07-07: qty 5

## 2015-07-07 MED ORDER — PROPOFOL 10 MG/ML IV BOLUS
INTRAVENOUS | Status: DC | PRN
  Administered 2015-07-07: 150 mg via INTRAVENOUS

## 2015-07-07 MED ORDER — PROPOFOL 10 MG/ML IV BOLUS
INTRAVENOUS | Status: AC
  Filled 2015-07-07: qty 20

## 2015-07-07 MED ORDER — FENTANYL CITRATE (PF) 100 MCG/2ML IJ SOLN
INTRAMUSCULAR | Status: DC | PRN
  Administered 2015-07-07: 50 ug via INTRAVENOUS
  Administered 2015-07-07: 150 ug via INTRAVENOUS
  Administered 2015-07-07 (×2): 50 ug via INTRAVENOUS

## 2015-07-07 MED ORDER — EVICEL 5 ML EX KIT
PACK | CUTANEOUS | Status: AC
  Filled 2015-07-07: qty 1

## 2015-07-07 MED ORDER — DEXAMETHASONE SODIUM PHOSPHATE 4 MG/ML IJ SOLN
INTRAMUSCULAR | Status: AC
  Filled 2015-07-07: qty 2

## 2015-07-07 MED ORDER — ROCURONIUM BROMIDE 50 MG/5ML IV SOLN
INTRAVENOUS | Status: AC
  Filled 2015-07-07: qty 1

## 2015-07-07 MED ORDER — LACTATED RINGERS IV SOLN
INTRAVENOUS | Status: DC | PRN
  Administered 2015-07-07 (×2): via INTRAVENOUS

## 2015-07-07 MED ORDER — FENTANYL CITRATE (PF) 100 MCG/2ML IJ SOLN
25.0000 ug | INTRAMUSCULAR | Status: DC | PRN
  Administered 2015-07-07 (×2): 50 ug via INTRAVENOUS

## 2015-07-07 MED ORDER — HYDROMORPHONE 1 MG/ML IV SOLN
INTRAVENOUS | Status: AC
  Filled 2015-07-07: qty 25

## 2015-07-07 MED ORDER — 0.9 % SODIUM CHLORIDE (POUR BTL) OPTIME
TOPICAL | Status: DC | PRN
  Administered 2015-07-07 (×4): 1000 mL

## 2015-07-07 MED ORDER — NALOXONE HCL 0.4 MG/ML IJ SOLN
0.4000 mg | INTRAMUSCULAR | Status: DC | PRN
  Filled 2015-07-07: qty 1

## 2015-07-07 MED ORDER — HYDROMORPHONE 1 MG/ML IV SOLN
INTRAVENOUS | Status: DC
  Administered 2015-07-07: 0.6 mg via INTRAVENOUS
  Administered 2015-07-07: 1.6 mg via INTRAVENOUS
  Administered 2015-07-07: 2.4 mg via INTRAVENOUS
  Administered 2015-07-07: 13:00:00 via INTRAVENOUS
  Administered 2015-07-08: 0.8 mg via INTRAVENOUS
  Administered 2015-07-08: 1.8 mg via INTRAVENOUS
  Administered 2015-07-08: 1.6 mg via INTRAVENOUS
  Administered 2015-07-08 (×2): 0.6 mg via INTRAVENOUS
  Administered 2015-07-08: 1.6 mg via INTRAVENOUS
  Administered 2015-07-09: 1.4 mg via INTRAVENOUS
  Administered 2015-07-09: 1.6 mg via INTRAVENOUS
  Administered 2015-07-09 – 2015-07-10 (×2): 1.4 mg via INTRAVENOUS
  Administered 2015-07-10: 0.8 mg via INTRAVENOUS
  Administered 2015-07-10 (×2): 1 mg via INTRAVENOUS
  Administered 2015-07-10: 0.4 mg via INTRAVENOUS
  Administered 2015-07-10: 11:00:00 via INTRAVENOUS
  Administered 2015-07-10: 0.6 mg via INTRAVENOUS
  Administered 2015-07-11 (×2): 0.8 mg via INTRAVENOUS
  Administered 2015-07-11: 1.4 mg via INTRAVENOUS
  Administered 2015-07-11: 1 mg via INTRAVENOUS
  Administered 2015-07-11: 1.6 mg via INTRAVENOUS
  Administered 2015-07-11: 0.6 mg via INTRAVENOUS
  Administered 2015-07-11: 1 mg via INTRAVENOUS
  Administered 2015-07-12: 1.8 mg via INTRAVENOUS
  Administered 2015-07-12: 0.6 mg via INTRAVENOUS
  Filled 2015-07-07: qty 25

## 2015-07-07 MED ORDER — MIDAZOLAM HCL 5 MG/5ML IJ SOLN
INTRAMUSCULAR | Status: DC | PRN
  Administered 2015-07-07: 2 mg via INTRAVENOUS

## 2015-07-07 MED ORDER — CHLORHEXIDINE GLUCONATE CLOTH 2 % EX PADS
6.0000 | MEDICATED_PAD | Freq: Every day | CUTANEOUS | Status: AC
  Administered 2015-07-08 – 2015-07-12 (×5): 6 via TOPICAL

## 2015-07-07 MED ORDER — FENTANYL CITRATE (PF) 100 MCG/2ML IJ SOLN
INTRAMUSCULAR | Status: AC
  Filled 2015-07-07: qty 2

## 2015-07-07 MED ORDER — SODIUM CHLORIDE 0.9% FLUSH: 9.0000 mL | INTRAVENOUS | Status: DC | PRN

## 2015-07-07 MED ORDER — MUPIROCIN 2 % EX OINT
1.0000 "application " | TOPICAL_OINTMENT | Freq: Two times a day (BID) | CUTANEOUS | Status: AC
  Administered 2015-07-07 – 2015-07-12 (×10): 1 via NASAL
  Filled 2015-07-07 (×3): qty 22

## 2015-07-07 MED ORDER — ROCURONIUM BROMIDE 100 MG/10ML IV SOLN
INTRAVENOUS | Status: DC | PRN
  Administered 2015-07-07: 10 mg via INTRAVENOUS
  Administered 2015-07-07: 50 mg via INTRAVENOUS

## 2015-07-07 MED ORDER — ONDANSETRON HCL 4 MG/2ML IJ SOLN
4.0000 mg | Freq: Four times a day (QID) | INTRAMUSCULAR | Status: DC | PRN
  Administered 2015-07-07: 4 mg via INTRAVENOUS
  Filled 2015-07-07 (×2): qty 2

## 2015-07-07 MED ORDER — GLYCOPYRROLATE 0.2 MG/ML IJ SOLN
INTRAMUSCULAR | Status: DC | PRN
  Administered 2015-07-07: 0.4 mg via INTRAVENOUS

## 2015-07-07 MED ORDER — NEOSTIGMINE METHYLSULFATE 10 MG/10ML IV SOLN
INTRAVENOUS | Status: DC | PRN
  Administered 2015-07-07: 3 mg via INTRAVENOUS

## 2015-07-07 MED ORDER — MENTHOL 3 MG MT LOZG
1.0000 | LOZENGE | OROMUCOSAL | Status: DC | PRN
  Administered 2015-07-07: 3 mg via ORAL
  Filled 2015-07-07: qty 9

## 2015-07-07 MED ORDER — ONDANSETRON HCL 4 MG/2ML IJ SOLN
INTRAMUSCULAR | Status: DC | PRN
  Administered 2015-07-07: 4 mg via INTRAVENOUS

## 2015-07-07 MED ORDER — MIDAZOLAM HCL 2 MG/2ML IJ SOLN
INTRAMUSCULAR | Status: AC
  Filled 2015-07-07: qty 2

## 2015-07-07 MED ORDER — DIPHENHYDRAMINE HCL 50 MG/ML IJ SOLN
12.5000 mg | Freq: Four times a day (QID) | INTRAMUSCULAR | Status: DC | PRN
  Filled 2015-07-07: qty 0.25

## 2015-07-07 MED ORDER — CHLORHEXIDINE GLUCONATE 0.12 % MT SOLN
15.0000 mL | Freq: Two times a day (BID) | OROMUCOSAL | Status: DC
  Administered 2015-07-07 – 2015-07-13 (×10): 15 mL via OROMUCOSAL
  Filled 2015-07-07 (×12): qty 15

## 2015-07-07 MED ORDER — CETYLPYRIDINIUM CHLORIDE 0.05 % MT LIQD
7.0000 mL | Freq: Two times a day (BID) | OROMUCOSAL | Status: DC
  Administered 2015-07-08 – 2015-07-13 (×11): 7 mL via OROMUCOSAL

## 2015-07-07 MED ORDER — IOPAMIDOL (ISOVUE-300) INJECTION 61%
80.0000 mL | Freq: Once | INTRAVENOUS | Status: AC | PRN
  Administered 2015-07-06: 80 mL via INTRAVENOUS

## 2015-07-07 MED ORDER — DIPHENHYDRAMINE HCL 12.5 MG/5ML PO ELIX
12.5000 mg | ORAL_SOLUTION | Freq: Four times a day (QID) | ORAL | Status: DC | PRN
Start: 2015-07-07 — End: 2015-07-12
  Filled 2015-07-07: qty 5

## 2015-07-07 MED ORDER — LIDOCAINE HCL (CARDIAC) 20 MG/ML IV SOLN
INTRAVENOUS | Status: DC | PRN
  Administered 2015-07-07: 100 mg via INTRAVENOUS

## 2015-07-07 SURGICAL SUPPLY — 74 items
BENZOIN TINCTURE PRP APPL 2/3 (GAUZE/BANDAGES/DRESSINGS) ×3 IMPLANT
BLADE SURG ROTATE 9660 (MISCELLANEOUS) ×3 IMPLANT
CANISTER SUCTION 2500CC (MISCELLANEOUS) ×3 IMPLANT
CANISTER WOUND CARE 500ML ATS (WOUND CARE) ×3 IMPLANT
CHLORAPREP W/TINT 26ML (MISCELLANEOUS) ×3 IMPLANT
COVER MAYO STAND STRL (DRAPES) ×6 IMPLANT
COVER SURGICAL LIGHT HANDLE (MISCELLANEOUS) ×3 IMPLANT
DRAIN CHANNEL 19F RND (DRAIN) ×3 IMPLANT
DRAPE LAPAROSCOPIC ABDOMINAL (DRAPES) ×3 IMPLANT
DRAPE PROXIMA HALF (DRAPES) IMPLANT
DRAPE UTILITY XL STRL (DRAPES) IMPLANT
DRAPE WARM FLUID 44X44 (DRAPE) ×6 IMPLANT
DRSG OPSITE POSTOP 4X10 (GAUZE/BANDAGES/DRESSINGS) IMPLANT
DRSG OPSITE POSTOP 4X8 (GAUZE/BANDAGES/DRESSINGS) IMPLANT
DRSG VAC ATS MED SENSATRAC (GAUZE/BANDAGES/DRESSINGS) ×3 IMPLANT
ELECT BLADE 6.5 EXT (BLADE) ×3 IMPLANT
ELECT CAUTERY BLADE 6.4 (BLADE) ×3 IMPLANT
ELECT REM PT RETURN 9FT ADLT (ELECTROSURGICAL) ×3
ELECTRODE REM PT RTRN 9FT ADLT (ELECTROSURGICAL) ×2 IMPLANT
EVACUATOR SILICONE 100CC (DRAIN) ×3 IMPLANT
FILTER CHARCOAL DEPUY (MISCELLANEOUS) ×3 IMPLANT
GLOVE BIO SURGEON STRL SZ8 (GLOVE) ×3 IMPLANT
GLOVE BIOGEL PI IND STRL 7.0 (GLOVE) ×2 IMPLANT
GLOVE BIOGEL PI IND STRL 8 (GLOVE) ×10 IMPLANT
GLOVE BIOGEL PI INDICATOR 7.0 (GLOVE) ×1
GLOVE BIOGEL PI INDICATOR 8 (GLOVE) ×5
GLOVE ECLIPSE 7.5 STRL STRAW (GLOVE) ×9 IMPLANT
GLOVE ECLIPSE 8.0 STRL XLNG CF (GLOVE) ×3 IMPLANT
GOWN STRL REUS W/ TWL LRG LVL3 (GOWN DISPOSABLE) ×4 IMPLANT
GOWN STRL REUS W/ TWL LRG LVL4 (GOWN DISPOSABLE) ×8 IMPLANT
GOWN STRL REUS W/TWL LRG LVL3 (GOWN DISPOSABLE) ×2
GOWN STRL REUS W/TWL LRG LVL4 (GOWN DISPOSABLE) ×4
GOWN STRL REUS W/TWL XL LVL4 (GOWN DISPOSABLE) ×3 IMPLANT
KIT BASIN OR (CUSTOM PROCEDURE TRAY) ×3 IMPLANT
KIT DRAINAGE VACCUM ASSIST (KITS) ×3 IMPLANT
KIT OSTOMY DRAINABLE 2.75 STR (WOUND CARE) ×6 IMPLANT
KIT ROOM TURNOVER OR (KITS) ×3 IMPLANT
LEGGING LITHOTOMY PAIR STRL (DRAPES) IMPLANT
LIGASURE IMPACT 36 18CM CVD LR (INSTRUMENTS) ×3 IMPLANT
NS IRRIG 1000ML POUR BTL (IV SOLUTION) ×6 IMPLANT
PACK GENERAL/GYN (CUSTOM PROCEDURE TRAY) ×3 IMPLANT
PAD ARMBOARD 7.5X6 YLW CONV (MISCELLANEOUS) ×3 IMPLANT
PENCIL BUTTON HOLSTER BLD 10FT (ELECTRODE) IMPLANT
SEPRAFILM PROCEDURAL PACK 3X5 (MISCELLANEOUS) ×3 IMPLANT
SPECIMEN JAR X LARGE (MISCELLANEOUS) ×3 IMPLANT
SPONGE GAUZE 4X4 12PLY STER LF (GAUZE/BANDAGES/DRESSINGS) ×3 IMPLANT
SPONGE LAP 18X18 X RAY DECT (DISPOSABLE) ×6 IMPLANT
STAPLER CUT CVD 40MM BLUE (STAPLE) ×3 IMPLANT
STAPLER PROXIMATE 75MM BLUE (STAPLE) ×3 IMPLANT
STAPLER VISISTAT 35W (STAPLE) ×3 IMPLANT
SUCTION POOLE TIP (SUCTIONS) ×3 IMPLANT
SURGILUBE 2OZ TUBE FLIPTOP (MISCELLANEOUS) IMPLANT
SUT ETHILON 3 0 FSL (SUTURE) ×3 IMPLANT
SUT PDS AB 1 TP1 96 (SUTURE) ×6 IMPLANT
SUT PROLENE 2 0 CT2 30 (SUTURE) IMPLANT
SUT PROLENE 2 0 KS (SUTURE) IMPLANT
SUT SILK 2 0 (SUTURE) ×1
SUT SILK 2 0 SH CR/8 (SUTURE) ×3 IMPLANT
SUT SILK 2 0 TIES 10X30 (SUTURE) ×3 IMPLANT
SUT SILK 2-0 18XBRD TIE 12 (SUTURE) ×2 IMPLANT
SUT SILK 3 0 SH CR/8 (SUTURE) ×3 IMPLANT
SUT SILK 3 0 TIES 10X30 (SUTURE) ×3 IMPLANT
SUT VIC AB 3-0 SH 27 (SUTURE)
SUT VIC AB 3-0 SH 27X BRD (SUTURE) IMPLANT
SUT VIC AB 3-0 SH 8-18 (SUTURE) ×3 IMPLANT
SYR BULB IRRIGATION 50ML (SYRINGE) ×3 IMPLANT
TOWEL OR 17X24 6PK STRL BLUE (TOWEL DISPOSABLE) ×3 IMPLANT
TOWEL OR 17X26 10 PK STRL BLUE (TOWEL DISPOSABLE) ×6 IMPLANT
TRAY FOLEY CATH 14FRSI W/METER (CATHETERS) ×3 IMPLANT
TRAY PROCTOSCOPIC FIBER OPTIC (SET/KITS/TRAYS/PACK) IMPLANT
TUBE CONNECTING 12X1/4 (SUCTIONS) ×6 IMPLANT
UNDERPAD 30X30 INCONTINENT (UNDERPADS AND DIAPERS) IMPLANT
WATER STERILE IRR 1000ML POUR (IV SOLUTION) IMPLANT
YANKAUER SUCT BULB TIP NO VENT (SUCTIONS) ×6 IMPLANT

## 2015-07-07 NOTE — Progress Notes (Signed)
Colostomy bag intact with no drainage noted

## 2015-07-07 NOTE — Consult Note (Signed)
WOC requested for preoperative stoma site marking  Discussed surgical procedure and stoma creation with patient and family.  Explained role of the [facility] nurse team.  Provided the patient with educational booklet/DVD and provided samples of pouching options.  Answered patient questions.   Examined patient lying, sitting, and standing in order to place the marking in the patient's visual field, away from any creases or abdominal contour issues and within the rectus muscle.  Attempted to mark below the patient's belt line.    Marked for colostomy in the LLQ  2 cm to the left of the umbilicus and 4.5 cm above/below the umbilicus.   Cleaned site with CHG wipe, covered mark with thin film transparent dressing to preserve mark until date of surgery  WOC team will follow up with patient after surgery for continue ostomy care and teaching.  Cigi Bega [place] RN,CWOCN [phone]

## 2015-07-07 NOTE — Progress Notes (Signed)
Patient taken to short stay, this RN gave report to surgical short stay rn

## 2015-07-07 NOTE — Progress Notes (Signed)
Patient's son came to pick up patient's belongings including purse, shoes, clothing.

## 2015-07-07 NOTE — Care Management Note (Signed)
Case Management Note  Patient Details  Name: Shannon [name] MRN: [id number] Date of Birth: [birth year]  Subjective/Objective:                    Action/Plan: Patient was admitted with worsening abdominal pain. Plan is for a temporary colostomy. Lives at home alone. Will follow for discharge needs post-operatively.  Expected Discharge Date:                  Expected Discharge Plan:     In-House Referral:     Discharge planning Services     Post Acute Care Choice:    Choice offered to:     DME Arranged:    DME Agency:     HH Arranged:    HH Agency:     Status of Service:  In process, will continue to follow  Medicare Important Message Given:    Date Medicare IM Given:    Medicare IM give by:    Date Additional Medicare IM Given:    Additional Medicare Important Message give by:     If discussed at [place] of Stay Meetings, dates discussed:    Additional CommentsRolm Baptise, RN 07/07/2015, 11:24 AM [phone]

## 2015-07-07 NOTE — Progress Notes (Signed)
Pt has wound vac to abdomen at 185mm/mg on arrival to pacu

## 2015-07-07 NOTE — Progress Notes (Signed)
Patient still very sore in the suprapubic area and RLQ.  Will go ahead with colectomy and colostomy ASAP.  Patient understands the risks and benefits and wishes to proceed.  Kathryne Eriksson. Dahlia Bailiff, MD, [place] [phone] [facility] Surgery

## 2015-07-07 NOTE — Progress Notes (Signed)
Verbal order from Dr. Hulen Skains for NG tube to be set to low intermittent suction and wound vac to be set at 173mmHg.

## 2015-07-07 NOTE — Progress Notes (Signed)
Report given to robin roberts rn as caregiver 

## 2015-07-07 NOTE — Op Note (Signed)
OPERATIVE REPORT  DATE OF OPERATION:  07/07/2015  PATIENT:  Shannon [name]  65 y.o. female  PRE-OPERATIVE DIAGNOSIS:  Perforated acute diverticulitis  POST-OPERATIVE DIAGNOSIS:  Perforated acute diverticulitis  PROCEDURE:  Procedure(s): COLECTOMY WITH COLOSTOMY CREATION/HARTMANN PROCEDURE COLOSTOMY  SURGEON:  Surgeon(s): Judeth Horn, MD Georganna Skeans, MD  ASSISTANT: Grandville Silos, M.D.  ANESTHESIA:   general  EBL: 75 ml  BLOOD ADMINISTERED: none  DRAINS: Nasogastric Tube, Urinary Catheter (Foley) and (19Fr) Blake drain(s) in the pelvic above rectal stump in the abscess cavity   SPECIMEN:  Source of Specimen:  Sigmoid colon  COUNTS CORRECT:  YES  PROCEDURE DETAILS: The patient was taken to the operating room and placed on table in supine position. After an adequate general endotracheal anesthetic was administered, she was prepped and draped in usual sterile manner exposing her entire abdomen.  The patient had been marked preoperatively for a left sided colostomy. She was brought to the operating room because of peritonitis and a perforated diverticular abscess encapsulated by multiple loops of small bowel.  A proper timeout was performed identifying the patient and procedure to be performed. A lower midline incision was made using a #10 blade and taken down to almost the pubic crest. It was taken down to and through the midline fascia using electrocautery. Care was taken not to injure intra-abdominal contents during the process. We subsequently placed the patient in Trendelenburg position.  A Balfour retractor and reduction retractors are used to provide adequate exposure in the pelvis. A bladder blade was also attached to the [place]. The patient had multiple small bowel loops that were surrounding a mid sigmoid colon perforation with an abscess. The abscess cavity was drained and we inspected the small bowel loops carefully to make sure they were not the site of the primary  pathology. There is no evidence of direct injury or disease of the small bowel which was in the mid to distal jejunum. We subsequently packed the small bowel proximally in the abdomen, then mobilized the sigmoid colon at the line of Toldt. We transected the distal descending colon using a GIA-75 stapler. We then mobilized the distal descending colon and the sigmoid colon using electrocautery along the line of Toldt and a LigaSure device dividing its mesentery posteriorly.  Care was taken to stay very close to the colon wall to avoid injury to the ureters bilaterally. We were able to mobilize the distal sigmoid colon up to the proximal rectum at which point inflammation palpated in the wall was noted to be significantly improved. After mobilizing the mesentery down to the point we came across the distal sigmoid colon using a Contour stapling device. The specimen was marked proximally with a silk suture. The rectal stump was marked with 2 2-0 Prolene sutures.  We subsequently irrigated the pelvis and abdominal cavity with saline solution. The surgeon and assistant changed gowns and gloves and then came back for closure. Seprafilm was placed on top of the rectal stump to try to avoid small bowel adhesions to the stump. In [place] drain was placed in the pelvis. And brought out the right lower quadrant of the abdomen. It was sutured in place using 3-0 nylon suture.  We subsequently closed the fascia using running looped #1 PDS suture. A negative pressure on wound dressing was applied to the midline wound. The colostomy was brought out the left upper quadrant and subsequently matured with 3-0 Vicryl pop-off sutures. It was designed as a flush colostomy and not long with a  large nipple.  All wounds were dressed and the colostomy was covered. All needle counts, sponge counts, and instrument counts were correct.  PATIENT DISPOSITION:  PACU - hemodynamically stable.   Harel Repetto 3/30/201712:17 PM

## 2015-07-07 NOTE — Anesthesia Preprocedure Evaluation (Addendum)
Anesthesia Evaluation  Patient identified by MRN, date of birth, ID band Patient awake    Reviewed: Allergy & Precautions, H&P , Patient's Chart, lab work & pertinent test results, reviewed documented beta blocker date and time   Airway Mallampati: II  TM Distance: >3 FB Neck ROM: full    Dental no notable dental hx.    Pulmonary former smoker,    Pulmonary exam normal breath sounds clear to auscultation       Cardiovascular  Rhythm:regular Rate:Normal     Neuro/Psych    GI/Hepatic GERD  Medicated,  Endo/Other    Renal/GU      Musculoskeletal   Abdominal   Peds  Hematology   Anesthesia Other Findings   Reproductive/Obstetrics                            Anesthesia Physical Anesthesia Plan  ASA: II and emergent  Anesthesia Plan: General   Post-op Pain Management:    Induction: Intravenous  Airway Management Planned: Oral ETT  Additional Equipment:   Intra-op Plan:   Post-operative Plan: Extubation in OR  Informed Consent: I have reviewed the patients History and Physical, chart, labs and discussed the procedure including the risks, benefits and alternatives for the proposed anesthesia with the patient or authorized representative who has indicated his/her understanding and acceptance.   Dental Advisory Given and Dental advisory given  Plan Discussed with: CRNA and Surgeon  Anesthesia Plan Comments: (  Discussed general anesthesia, including possible nausea, instrumentation of airway, sore throat,pulmonary aspiration, etc. I asked if the were any outstanding questions, or  concerns before we proceeded. )        Anesthesia Quick Evaluation

## 2015-07-07 NOTE — Transfer of Care (Signed)
Immediate Anesthesia Transfer of Care Note  Patient: Shannon [name]  Procedure(s) Performed: Procedure(s): COLECTOMY WITH COLOSTOMY CREATION/HARTMANN PROCEDURE (N/A) COLOSTOMY (Left)  Patient Location: PACU  Anesthesia Type:General  Level of Consciousness: awake, alert , oriented and patient cooperative  Airway & Oxygen Therapy: Patient Spontanous Breathing and Patient connected to nasal cannula oxygen  Post-op Assessment: Report given to RN and Post -op Vital signs reviewed and stable  Post vital signs: Reviewed and stable  Last Vitals:  Filed Vitals:   07/07/15 0115 07/07/15 0550  BP: 130/67 123/65  Pulse: 104 88  Temp: 37.1 C 37.4 C  Resp: 18 18    Complications: No apparent anesthesia complications

## 2015-07-08 ENCOUNTER — Encounter (HOSPITAL_COMMUNITY): Payer: Self-pay | Admitting: General Surgery

## 2015-07-08 LAB — CBC
HEMATOCRIT: 35.7 % — AB (ref 36.0–46.0)
Hemoglobin: 11.9 g/dL — ABNORMAL LOW (ref 12.0–15.0)
MCH: 32.9 pg (ref 26.0–34.0)
MCHC: 33.3 g/dL (ref 30.0–36.0)
MCV: 98.6 fL (ref 78.0–100.0)
PLATELETS: 283 10*3/uL (ref 150–400)
RBC: 3.62 MIL/uL — ABNORMAL LOW (ref 3.87–5.11)
RDW: 13.5 % (ref 11.5–15.5)
WBC: 9.6 10*3/uL (ref 4.0–10.5)

## 2015-07-08 LAB — BASIC METABOLIC PANEL
Anion gap: 9 (ref 5–15)
BUN: 15 mg/dL (ref 6–20)
CALCIUM: 8.3 mg/dL — AB (ref 8.9–10.3)
CO2: 20 mmol/L — ABNORMAL LOW (ref 22–32)
CREATININE: 0.84 mg/dL (ref 0.44–1.00)
Chloride: 110 mmol/L (ref 101–111)
GFR calc Af Amer: >60 mL/min (ref >60)
GLUCOSE: 94 mg/dL (ref 65–99)
POTASSIUM: 4.3 mmol/L (ref 3.5–5.1)
SODIUM: 139 mmol/L (ref 135–145)

## 2015-07-08 NOTE — Telephone Encounter (Signed)
Left message on machine to call back I have left several messages for the pt with no response.  I will wait for further communication for the pt

## 2015-07-08 NOTE — Consult Note (Signed)
WOC wound follow up Wound type: surgical  Measurement: first post op dressing change for 07/09/15 per CCS/bedside nurse Dressing procedure/placement/frequency: Dressing intact from [place].  Minimal drainage serosanguinous in canister  WOC ostomy follow up Stoma type/location: LLQ, end colostomy Stomal assessment/size: oval shaped, flush with the skin  Peristomal assessment: pouch intact from OR Treatment options for stomal/peristomal skin: NA Output sweat in pouch, no stool  Ostomy pouching: 2pc. In place from OR. May need convexity due to flush stoma, however abdomen is quite round, will order 1pc with barrier ring and convex with belt to try.  Education provided: admission booklet in the room, patient requested it to be placed on bedside table for her Enrolled patient in [employer] Discharge program: No, will wait until I see what will work best for this patient.  WOC will follow along with you for support with ostomy care and teaching.   Montie Gelardi [facility] RN,CWOCN

## 2015-07-08 NOTE — Evaluation (Signed)
Physical Therapy Evaluation Patient Details Name: Shannon [name] MRN: [id number] DOB: April 21, 1950 Today's Date: 07/08/2015   History of Present Illness  65 y.o. female admitted with Perforated acute diverticulitis and now s/p COLECTOMY WITH COLOSTOMY CREATION/HARTMANN PROCEDURE. PMH: direrticulitis, breast cancer, arthritis.   Clinical Impression  Pt admitted with above diagnosis. Pt currently with functional limitations due to the deficits listed below (see PT Problem List).  Pt will benefit from skilled PT to increase their independence and safety with mobility to allow discharge to home. Pt reporting that she will be alone when she leaves the hospital and will need to be as independent as possible.        Follow Up Recommendations Supervision for mobility/OOB;No PT follow up    Equipment Recommendations  Rolling walker with 5" wheels    Recommendations for Other Services       Precautions / Restrictions Precautions Precautions: Fall Precaution Comments: colostomy Restrictions Weight Bearing Restrictions: No      Mobility  Bed Mobility Overal bed mobility: Needs Assistance Bed Mobility: Sit to Sidelying;Rolling Rolling: Supervision       Sit to sidelying: Min assist (LEs and cues for sequence) General bed mobility comments: cues for logroll technique and assist with lines.   Transfers Overall transfer level: Needs assistance Equipment used: Rolling walker (2 wheeled) Transfers: Sit to/from Stand Sit to Stand: Min assist         General transfer comment: cues for hand placement  Ambulation/Gait Ambulation/Gait assistance: Min guard Ambulation Distance (Feet): 15 Feet Assistive device: Rolling walker (2 wheeled) Gait Pattern/deviations: Step-through pattern;Decreased step length - right;Decreased step length - left Gait velocity: very slow   General Gait Details: very slow pattern, reports having some Rt groin pain with Rt hip flexion  Stairs             Wheelchair Mobility    Modified Rankin (Stroke Patients Only)       Balance Overall balance assessment: Needs assistance Sitting-balance support: No upper extremity supported Sitting balance-Leahy Scale: Good     Standing balance support: Bilateral upper extremity supported Standing balance-Leahy Scale: Poor Standing balance comment: using rw                             Pertinent Vitals/Pain Pain Assessment: 0-10 Pain Score: 5  Pain Location: abdomen Pain Descriptors / Indicators: Sore Pain Intervention(s): Limited activity within patient's tolerance;Monitored during session    Home Living Family/patient expects to be discharged to:: Private residence Living Arrangements: Alone   Type of Home: House Home Access: Stairs to enter Entrance Stairs-Rails: Psychiatric nurse of Steps: 3-4 Home Layout: One level Home Equipment: None      Prior Function Level of Independence: Independent               Hand Dominance        Extremity/Trunk Assessment   Upper Extremity Assessment: Overall WFL for tasks assessed           Lower Extremity Assessment: Generalized weakness         Communication   Communication: No difficulties  Cognition Arousal/Alertness: Awake/alert Behavior During Therapy: WFL for tasks assessed/performed Overall Cognitive Status: Within Functional Limits for tasks assessed                      General Comments      Exercises        Assessment/Plan    PT  Assessment Patient needs continued PT services  PT Diagnosis Difficulty walking   PT Problem List Decreased strength;Decreased range of motion;Decreased activity tolerance;Decreased balance;Decreased mobility;Decreased coordination;Decreased knowledge of use of DME  PT Treatment Interventions DME instruction;Gait training;Stair training;Functional mobility training;Therapeutic activities;Therapeutic exercise;Patient/family education    PT Goals (Current goals can be found in the Care Plan section) Acute Rehab PT Goals Patient Stated Goal: to go home PT Goal Formulation: With patient Time For Goal Achievement: 07/22/15 Potential to Achieve Goals: Good    Frequency Min 3X/week   Barriers to discharge        Co-evaluation               End of Session Equipment Utilized During Treatment: Gait belt Activity Tolerance: Patient tolerated treatment well;Patient limited by fatigue Patient left: in bed;with call bell/phone within reach;with SCD's reapplied Nurse Communication: Mobility status         Time: [id number] PT Time Calculation (min) (ACUTE ONLY): 39 min   Charges:   PT Evaluation $PT Eval Moderate Complexity: 1 Procedure PT Treatments $Gait Training: 8-22 mins $Therapeutic Activity: 8-22 mins   PT G Codes:        Cassell Clement, PT, CSCS Pager [phone] Office [phone]  07/08/2015, 3:56 PM

## 2015-07-08 NOTE — Care Management Note (Addendum)
Case Management Note  Patient Details  Name: SULEYMA MOSER MRN: [id number] Date of Birth: [birth year]  Subjective/Objective:                    Action/Plan: VAC application faxed to [facility] application completed except for wound measurements . WIll fax to [facility] once measurements received. VAC application left in shadow chart . [employer] fax number [phone] [place] RN BSN [phone]   Expected Discharge Date:                  Expected Discharge Plan:  [place]  In-House Referral:     Discharge planning Services     Post Acute Care Choice:    Choice offered to:     DME Arranged:    DME Agency:     HH Arranged:    HH Agency:     Status of Service:  In process, will continue to follow  Medicare Important Message Given:    Date Medicare IM Given:    Medicare IM give by:    Date Additional Medicare IM Given:    Additional Medicare Important Message give by:     If discussed at [place] of Stay Meetings, dates discussed:    Additional Comments:  Marilu Favre, RN 07/08/2015, 10:11 AM

## 2015-07-08 NOTE — Progress Notes (Signed)
1 Day Post-Op  Subjective: No flatus, good pain control with PCA  Objective: Vital signs in last 24 hours: Temp:  [97.5 F (36.4 C)-100.5 F (38.1 C)] 99.4 F (37.4 C) (03/31 0515) Pulse Rate:  [89-100] 94 (03/31 0515) Resp:  [7-22] 13 (03/31 0800) BP: (107-149)/(56-128) 125/72 mmHg (03/31 0515) SpO2:  [92 %-98 %] 98 % (03/31 0800) FiO2 (%):  [96 %-97 %] 96 % (03/31 0359) Weight:  [93.26 kg (205 lb 9.6 oz)] 93.26 kg (205 lb 9.6 oz) (03/30 1635) Last BM Date: 07/07/15  Intake/Output from previous day: 03/30 0701 - 03/31 0700 In: 3751.7 [P.O.:480; I.V.:3041.7; NG/GT:30; IV Piggyback:200] Out: [id number] [Urine:1025; Emesis/NG output:750; Drains:170; Blood:100] Intake/Output this shift:    General appearance: alert and cooperative Resp: clear to auscultation bilaterally Cardio: S1, S2 normal GI: soft, VAC in place, drain serosanguinous, quiet, ostomy dark pink with minimal output  Lab Results:   Recent Labs  07/06/15 2020 07/08/15 0525  WBC 14.7* 9.6  HGB 15.2* 11.9*  HCT 42.5 35.7*  PLT 320 283   BMET  Recent Labs  07/06/15 2020 07/08/15 0525  NA 134* 139  K 3.6 4.3  CL 101 110  CO2 21* 20*  GLUCOSE 108* 94  BUN 10 15  CREATININE 0.90 0.84  CALCIUM 9.7 8.3*   PT/INR  Recent Labs  07/06/15 2020  LABPROT 14.9  INR 1.15   Anti-infectives: Anti-infectives    Start     Dose/Rate Route Frequency Ordered Stop   07/06/15 2200  piperacillin-tazobactam (ZOSYN) IVPB 3.375 g     3.375 g 12.5 mL/hr over 240 Minutes Intravenous 3 times per day 07/06/15 1955        Assessment/Plan: s/p Procedure(s): COLECTOMY WITH COLOSTOMY CREATION/HARTMANN PROCEDURE (N/A) COLOSTOMY (Left) .  POD#1 Await return of bowel function as had associated SBO Zosyn Lovenox D/C foley POD#2 PT eval Labs in AM  LOS: 2 days    Waunetta Riggle E 07/08/2015

## 2015-07-09 LAB — CBC
HEMATOCRIT: 33.1 % — AB (ref 36.0–46.0)
HEMOGLOBIN: 10.7 g/dL — AB (ref 12.0–15.0)
MCH: 31.6 pg (ref 26.0–34.0)
MCHC: 32.3 g/dL (ref 30.0–36.0)
MCV: 97.6 fL (ref 78.0–100.0)
Platelets: 271 10*3/uL (ref 150–400)
RBC: 3.39 MIL/uL — AB (ref 3.87–5.11)
RDW: 13.2 % (ref 11.5–15.5)
WBC: 9.1 10*3/uL (ref 4.0–10.5)

## 2015-07-09 LAB — BASIC METABOLIC PANEL
Anion gap: 7 (ref 5–15)
BUN: 12 mg/dL (ref 6–20)
CHLORIDE: 109 mmol/L (ref 101–111)
CO2: 22 mmol/L (ref 22–32)
Calcium: 8.3 mg/dL — ABNORMAL LOW (ref 8.9–10.3)
Creatinine, Ser: 0.77 mg/dL (ref 0.44–1.00)
GFR calc non Af Amer: >60 mL/min (ref >60)
Glucose, Bld: 63 mg/dL — ABNORMAL LOW (ref 65–99)
POTASSIUM: 5.1 mmol/L (ref 3.5–5.1)
SODIUM: 138 mmol/L (ref 135–145)

## 2015-07-09 NOTE — Progress Notes (Signed)
Physical Therapy Treatment Patient Details Name: Shannon [name] MRN: [id number] DOB: September 18, 1950 Today's Date: 07/09/2015    History of Present Illness 65 y.o. female admitted with Perforated acute diverticulitis and now s/p COLECTOMY WITH COLOSTOMY CREATION/HARTMANN PROCEDURE. PMH: direrticulitis, breast cancer, arthritis.     PT Comments    Pt with 10/10 pain today, but did reluctantly agree to ambulate in room.  Pt with almost shuffeling pattern and extremely slow gait.  When pain decreases, will probably move better.  Will continue to assess if she needs any follow up PT based on further assessment of mobility as pain decreases.   Follow Up Recommendations  Supervision for mobility/OOB;No PT follow up     Equipment Recommendations  Rolling walker with 5" wheels    Recommendations for Other Services       Precautions / Restrictions Precautions Precautions: Fall Precaution Comments: colostomy, wound vac, NG tube Restrictions Weight Bearing Restrictions: No    Mobility  Bed Mobility Overal bed mobility: Needs Assistance Bed Mobility: Rolling;Sit to Sidelying Rolling: Supervision       Sit to sidelying: Min assist General bed mobility comments: A for LE.  Pt able to scoot self up in bed with increased time.  Transfers Overall transfer level: Needs assistance Equipment used: Rolling walker (2 wheeled) Transfers: Sit to/from [facility] Sit to Stand: Min guard Stand pivot transfers: Min guard       General transfer comment: cues for hand placement  Ambulation/Gait Ambulation/Gait assistance: Min guard Ambulation Distance (Feet): 26 Feet (in room only) Assistive device: Rolling walker (2 wheeled) Gait Pattern/deviations: Decreased step length - right;Decreased step length - left;Shuffle Gait velocity: very slow Gait velocity interpretation: Below normal speed for age/gender General Gait Details: Extremely slow shuffled gait pattern. Pt with  10/10 pain and unable to ambulate outside of room.   Stairs            Wheelchair Mobility    Modified Rankin (Stroke Patients Only)       Balance Overall balance assessment: Needs assistance Sitting-balance support: No upper extremity supported Sitting balance-Leahy Scale: Good       Standing balance-Leahy Scale: Poor Standing balance comment: using RW                    Cognition Arousal/Alertness: Awake/alert Behavior During Therapy: WFL for tasks assessed/performed Overall Cognitive Status: Within Functional Limits for tasks assessed                      Exercises      General Comments General comments (skin integrity, edema, etc.): Pt needing encouragement to increase independence with activities she do.  Requires increased time due to multiple lines and pt needing increased time for decisions.      Pertinent Vitals/Pain Pain Assessment: 0-10 Pain Score: 10-Worst pain ever Pain Location: Abd Pain Descriptors / Indicators: Sore Pain Intervention(s): Monitored during session;PCA encouraged;Repositioned    Home Living                      Prior Function            PT Goals (current goals can now be found in the care plan section) Acute Rehab PT Goals Patient Stated Goal: to go home PT Goal Formulation: With patient Time For Goal Achievement: 07/22/15 Potential to Achieve Goals: Good Progress towards PT goals: Progressing toward goals    Frequency  Min 3X/week    PT Plan Current plan  remains appropriate    Co-evaluation             End of Session Equipment Utilized During Treatment: Gait belt Activity Tolerance: Patient limited by pain Patient left: in bed;with call bell/phone within reach;with SCD's reapplied     Time: [id number] PT Time Calculation (min) (ACUTE ONLY): 42 min  Charges:  $Gait Training: 8-22 mins $Therapeutic Activity: 23-37 mins                    G Codes:      Shannon Kressin  [name] 07/09/2015, 4:01 PM

## 2015-07-09 NOTE — Progress Notes (Signed)
[facility]:  [phone] General Surgery Progress Note   LOS: 3 days  POD -  2 Days Post-Op  Assessment/Plan: 1.  COLECTOMY WITH COLOSTOMY, CREATION/HARTMANN PROCEDURE, COLOSTOMY - 07/07/2015 - [facility]  Zosyn -   Looks good for just 2 days post op.  Needs to ambulate more  2.  Isolation secondary to MRSA 3.  Foley out this AM 4.  DVT prophylaxis - Lovenox  Active Problems:   Diverticulitis of sigmoid colon   Subjective:  Doing well.  Pain well controlled.  Objective:   Filed Vitals:   07/09/15 0521 07/09/15 0524  BP: 133/63   Pulse: 79   Temp: 99.8 F (37.7 C)   Resp: 16 16     Intake/Output from previous day:  03/31 0701 - 04/01 0700 In: [id number] [P.O.:1680; I.V.:3125; IV Piggyback:150] Out: [id number] [Urine:2200; Emesis/NG output:1650; Drains:80]  Intake/Output this shift:      Physical Exam:   General: WN Older WF who is alert and oriented.    HEENT: Normal. Pupils equal. .   Lungs: Clear   Abdomen: Soft, but quiet   Wound: VAC on wound.  Drain in RLQ - 60 cc recorded last 24 hours.   Lab Results:    Recent Labs  07/08/15 0525 07/09/15 0538  WBC 9.6 9.1  HGB 11.9* 10.7*  HCT 35.7* 33.1*  PLT 283 271    BMET   Recent Labs  07/08/15 0525 07/09/15 0538  NA 139 138  K 4.3 5.1  CL 110 109  CO2 20* 22  GLUCOSE 94 63*  BUN 15 12  CREATININE 0.84 0.77  CALCIUM 8.3* 8.3*    PT/INR   Recent Labs  07/06/15 2020  LABPROT 14.9  INR 1.15    ABG  No results for input(s): PHART, HCO3 in the last 72 hours.  Invalid input(s): PCO2, PO2   Studies/Results:  No results found.   Anti-infectives:   Anti-infectives    Start     Dose/Rate Route Frequency Ordered Stop   07/06/15 2200  piperacillin-tazobactam (ZOSYN) IVPB 3.375 g     3.375 g 12.5 mL/hr over 240 Minutes Intravenous 3 times per day 07/06/15 1955        Alphonsa Overall, MD, FACS Pager: [phone] Surgery Office: [phone] 07/09/2015

## 2015-07-09 NOTE — Progress Notes (Signed)
Changed wound VAC dressing with 2 separate pieces of black filler.  Wound measurement 17 cm long x 4 cm wide. Wound is complicated by the perineal folds and making sure there is a good seal at that area.  Left stoma flush and placed ring around stoma for a good seal. One piece used. Had to complete VAC and place ostomy pouch over the transparent VAC dressing.

## 2015-07-09 NOTE — Progress Notes (Signed)
PT in working with pt

## 2015-07-10 LAB — CBC WITH DIFFERENTIAL/PLATELET
BASOS ABS: 0 10*3/uL (ref 0.0–0.1)
BASOS PCT: 0 %
Band Neutrophils: 0 %
Blasts: 0 %
EOS ABS: 0.2 10*3/uL (ref 0.0–0.7)
EOS PCT: 2 %
HCT: 36.5 % (ref 36.0–46.0)
HEMOGLOBIN: 12.3 g/dL (ref 12.0–15.0)
LYMPHS ABS: 1 10*3/uL (ref 0.7–4.0)
Lymphocytes Relative: 9 %
MCH: 32.7 pg (ref 26.0–34.0)
MCHC: 33.7 g/dL (ref 30.0–36.0)
MCV: 97.1 fL (ref 78.0–100.0)
METAMYELOCYTES PCT: 0 %
MONO ABS: 1 10*3/uL (ref 0.1–1.0)
MYELOCYTES: 0 %
Monocytes Relative: 9 %
Neutro Abs: 8.8 10*3/uL — ABNORMAL HIGH (ref 1.7–7.7)
Neutrophils Relative %: 80 %
Other: 0 %
PLATELETS: 320 10*3/uL (ref 150–400)
PROMYELOCYTES ABS: 0 %
RBC: 3.76 MIL/uL — ABNORMAL LOW (ref 3.87–5.11)
RDW: 12.8 % (ref 11.5–15.5)
WBC: 11 10*3/uL — ABNORMAL HIGH (ref 4.0–10.5)
nRBC: 0 /100 WBC

## 2015-07-10 LAB — BASIC METABOLIC PANEL
ANION GAP: 13 (ref 5–15)
BUN: 8 mg/dL (ref 6–20)
CO2: 23 mmol/L (ref 22–32)
Calcium: 8.5 mg/dL — ABNORMAL LOW (ref 8.9–10.3)
Chloride: 99 mmol/L — ABNORMAL LOW (ref 101–111)
Creatinine, Ser: 0.71 mg/dL (ref 0.44–1.00)
Glucose, Bld: 57 mg/dL — ABNORMAL LOW (ref 65–99)
POTASSIUM: 4.2 mmol/L (ref 3.5–5.1)
SODIUM: 135 mmol/L (ref 135–145)

## 2015-07-10 MED ORDER — SODIUM CHLORIDE 0.9% FLUSH
10.0000 mL | Freq: Two times a day (BID) | INTRAVENOUS | Status: DC
  Administered 2015-07-10 – 2015-07-15 (×6): 10 mL

## 2015-07-10 MED ORDER — SODIUM CHLORIDE 0.9% FLUSH
10.0000 mL | INTRAVENOUS | Status: DC | PRN
  Administered 2015-07-12 – 2015-07-13 (×2): 10 mL
  Filled 2015-07-10 (×2): qty 40

## 2015-07-10 NOTE — Progress Notes (Signed)
Peripherally Inserted Central Catheter/Midline Placement  The IV Nurse has discussed with the patient and/or persons authorized to consent for the patient, the purpose of this procedure and the potential benefits and risks involved with this procedure.  The benefits include less needle sticks, lab draws from the catheter and patient may be discharged home with the catheter.  Risks include, but not limited to, infection, bleeding, blood clot (thrombus formation), and puncture of an artery; nerve damage and irregular heat beat.  Alternatives to this procedure were also discussed.  PICC/Midline Placement Documentation  PICC Double Lumen 07/10/15 PICC Left Brachial 42 cm 0 cm (Active)  Indication for Insertion or Continuance of Line Poor Vasculature-patient has had multiple peripheral attempts or PIVs lasting less than 24 hours 07/10/2015  5:58 PM  Exposed Catheter (cm) 0 cm 07/10/2015  5:58 PM  Site Assessment Clean;Dry;Intact 07/10/2015  5:58 PM  Lumen #1 Status Flushed;Saline locked;Blood return noted 07/10/2015  5:58 PM  Lumen #2 Status Flushed;Saline locked;Blood return noted 07/10/2015  5:58 PM  Dressing Type Transparent 07/10/2015  5:58 PM  Dressing Status Clean;Dry;Intact 07/10/2015  5:58 PM  Dressing Change Due 07/17/15 07/10/2015  5:58 PM       [name] 07/10/2015, 6:00 PM

## 2015-07-10 NOTE — Progress Notes (Signed)
[facility]:  [phone] General Surgery Progress Note   LOS: 4 days  POD -  3 Days Post-Op  Assessment/Plan: 1.  COLECTOMY WITH COLOSTOMY, CREATION/HARTMANN PROCEDURE, COLOSTOMY - 07/07/2015 - [facility]  Zosyn -   WBC up a little - 11,000  Will remove NGT, keep NPO.  Needs to ambulate more.  2.  Isolation secondary to MRSA 3.  DVT prophylaxis - Lovenox   Active Problems:   Diverticulitis of sigmoid colon  Subjective:  Did not have as good a day yesterday.  Needs to ambulate more.    Objective:   Filed Vitals:   07/10/15 0402 07/10/15 0533  BP:  126/60  Pulse:  88  Temp:  98.9 F (37.2 C)  Resp: 17 18     Intake/Output from previous day:  04/01 0701 - 04/02 0700 In: 1761.1 [P.O.:240; I.V.:1471.1; IV Piggyback:50] Out: [id number] [Urine:2500; Emesis/NG output:850; Drains:50; Stool:25]  Intake/Output this shift:      Physical Exam:   General: WN Older WF who is alert and oriented.    HEENT: Normal. Pupils equal. .   Lungs: Clear   Abdomen: Soft.  Rare BS.   Wound: VAC on wound.  Drain in RLQ - 50 cc recorded last 24 hours.   Lab Results:     Recent Labs  07/09/15 0538 07/10/15 0633  WBC 9.1 11.0*  HGB 10.7* 12.3  HCT 33.1* 36.5  PLT 271 320    BMET    Recent Labs  07/08/15 0525 07/09/15 0538  NA 139 138  K 4.3 5.1  CL 110 109  CO2 20* 22  GLUCOSE 94 63*  BUN 15 12  CREATININE 0.84 0.77  CALCIUM 8.3* 8.3*    PT/INR  No results for input(s): LABPROT, INR in the last 72 hours.  ABG  No results for input(s): PHART, HCO3 in the last 72 hours.  Invalid input(s): PCO2, PO2   Studies/Results:  No results found.   Anti-infectives:   Anti-infectives    Start     Dose/Rate Route Frequency Ordered Stop   07/06/15 2200  piperacillin-tazobactam (ZOSYN) IVPB 3.375 g     3.375 g 12.5 mL/hr over 240 Minutes Intravenous 3 times per day 07/06/15 1955        Alphonsa Overall, MD, FACS Pager: [phone] Surgery  Office: [phone] 07/10/2015

## 2015-07-11 MED ORDER — KCL IN DEXTROSE-NACL 20-5-0.45 MEQ/L-%-% IV SOLN
INTRAVENOUS | Status: DC
  Administered 2015-07-11 – 2015-07-12 (×3): via INTRAVENOUS
  Filled 2015-07-11 (×3): qty 1000

## 2015-07-11 MED ORDER — HYDROCORTISONE 1 % EX CREA
TOPICAL_CREAM | CUTANEOUS | Status: DC | PRN
  Administered 2015-07-11 – 2015-07-13 (×2): via TOPICAL
  Filled 2015-07-11 (×2): qty 28

## 2015-07-11 NOTE — Progress Notes (Signed)
4 Days Post-Op  Subjective: Feels better, slept well, no n/v  Objective: Vital signs in last 24 hours: Temp:  [98.6 F (37 C)-99.1 F (37.3 C)] 98.6 F (37 C) (04/03 0231) Pulse Rate:  [84-85] 85 (04/03 0231) Resp:  [13-27] 13 (04/03 0817) BP: (150-157)/(70-81) 157/70 mmHg (04/03 0231) SpO2:  [94 %-98 %] 98 % (04/03 0817) Last BM Date: 07/10/15  Intake/Output from previous day: 04/02 0701 - 04/03 0700 In: 3096.7 [I.V.:2946.7; IV Piggyback:150] Out: 3510 [Urine:3500; Drains:10] Intake/Output this shift:    General appearance: no distress Resp: clear to auscultation bilaterally Cardio: regular rate and rhythm GI: vac in place approp tender good bs  Lab Results:   Recent Labs  07/09/15 0538 07/10/15 0633  WBC 9.1 11.0*  HGB 10.7* 12.3  HCT 33.1* 36.5  PLT 271 320   BMET  Recent Labs  07/09/15 0538 07/10/15 0633  NA 138 135  K 5.1 4.2  CL 109 99*  CO2 22 23  GLUCOSE 63* 57*  BUN 12 8  CREATININE 0.77 0.71  CALCIUM 8.3* 8.5*   PT/INR No results for input(s): LABPROT, INR in the last 72 hours. ABG No results for input(s): PHART, HCO3 in the last 72 hours.  Invalid input(s): PCO2, PO2  Studies/Results: No results found.  Anti-infectives: Anti-infectives    Start     Dose/Rate Route Frequency Ordered Stop   07/06/15 2200  piperacillin-tazobactam (ZOSYN) IVPB 3.375 g     3.375 g 12.5 mL/hr over 240 Minutes Intravenous 3 times per day 07/06/15 1955        Assessment/Plan: Pod 4 hartmanns  1. Continue pca 2. pulm toilet, oob, pt working with her 3. Will give clears today, appears ileus is resolving 4. Vac changes as scheduled 5. Check labs again in am 6. Lovenox, scds  [facility] 07/11/2015

## 2015-07-11 NOTE — Progress Notes (Signed)
Physical Therapy Treatment Patient Details Name: Shannon [name] MRN: [id number] DOB: [birth year] Today's Date: 07/11/2015    History of Present Illness 65 y.o. female admitted with Perforated acute diverticulitis and now s/p COLECTOMY WITH COLOSTOMY CREATION/HARTMANN PROCEDURE. PMH: direrticulitis, breast cancer, arthritis.     PT Comments    Pt pleasant and needing to void on arrival with supervision for lines to [place] pt able to perform pericare on her own. Pt with increased gait, activity tolerance and ability for stairs today all on RA with sats >93%. Pt encouraged to increase mobility with nursing staff as well as perform HEP. Will continue to follow to maximize independence, pt fatigued end of session.   Follow Up Recommendations  Supervision for mobility/OOB;No PT follow up     Equipment Recommendations  Rolling walker with 5" wheels    Recommendations for Other Services       Precautions / Restrictions Precautions Precautions: Fall Precaution Comments: colostomy, wound vac, PCA Restrictions Weight Bearing Restrictions: No    Mobility  Bed Mobility Overal bed mobility: Modified Independent             General bed mobility comments: with HOB 30 degrees and use of rail  Transfers     Transfers: Sit to/from Stand Sit to Stand: Supervision         General transfer comment: cues for hand placement  Ambulation/Gait Ambulation/Gait assistance: Supervision Ambulation Distance (Feet): 150 Feet Assistive device: Rolling walker (2 wheeled) Gait Pattern/deviations: Step-through pattern;Decreased stride length;Trunk flexed   Gait velocity interpretation: Below normal speed for age/gender General Gait Details: cues for posture and position in RW but limited due to RW too tall in lowest position   Stairs Stairs: Yes Stairs assistance: Min assist Stair Management: Backwards;With walker Number of Stairs: 3 General stair comments: cues for sequence, safety  and technique with handout provided  Wheelchair Mobility    Modified Rankin (Stroke Patients Only)       Balance                                    Cognition Arousal/Alertness: Awake/alert Behavior During Therapy: WFL for tasks assessed/performed Overall Cognitive Status: Within Functional Limits for tasks assessed                      Exercises General Exercises - Lower Extremity Long Arc Quad: AROM;Seated;Both;15 reps Hip Flexion/Marching: AROM;Seated;Both;15 reps    General Comments        Pertinent Vitals/Pain Pain Score: 6  Pain Location: abdomen Pain Descriptors / Indicators: Sore Pain Intervention(s): Limited activity within patient's tolerance;Premedicated before session;Repositioned;PCA encouraged    Home Living                      Prior Function            PT Goals (current goals can now be found in the care plan section) Progress towards PT goals: Progressing toward goals    Frequency       PT Plan Current plan remains appropriate    Co-evaluation             End of Session   Activity Tolerance: Patient tolerated treatment well Patient left: in chair;with call bell/phone within reach     Time: [id number] PT Time Calculation (min) (ACUTE ONLY): 36 min  Charges:  $Gait Training: 8-22 mins $Therapeutic Exercise: 8-22 mins  G CodesMelford Aase 2015-07-25, 2:20 PM Elwyn Reach, [place]

## 2015-07-11 NOTE — Care Management Note (Signed)
Case Management Note  Patient Details  Name: SHYLO SHANES MRN: [id number] Date of Birth: [birth year]  Subjective/Objective:                    Action/Plan:  UR updated . Will need home health orders  Expected Discharge Date:                  Expected Discharge Plan:  [place]  In-House Referral:     Discharge planning Services     Post Acute Care Choice:    Choice offered to:     DME Arranged:  Walker rolling, Vac DME Agency:  [place] [employer]  HH Arranged:    [facility] Agency:     Status of Service:  In process, will continue to follow  Medicare Important Message Given:    Date Medicare IM Given:    Medicare IM give by:    Date Additional Medicare IM Given:    Additional Medicare Important Message give by:     If discussed at [place] of Stay Meetings, dates discussed:    Additional Comments:  Marilu Favre, RN 07/11/2015, 3:15 PM

## 2015-07-11 NOTE — Consult Note (Signed)
WOC ostomy follow up Stoma type/location: LLQ, colostomy Stomal assessment/size: oval shaped, flush Peristomal assessment: pouch intact from change Saturday Treatment options for stomal/peristomal skin: barrier ring used to aid in seal Output none, sweat Ostomy pouching: 1pc. With 2" barrier ring Education provided: provided educational booklet, [employer] catalog for ordering supplies, and ostomy support group materials.  Answered questions about pouch and wound VAC machine for midline wound Enrolled patient in [employer] Discharge program: Yes  Requested bedside nurse to show ostomy video for patient to view today.   [place] nurse to follow along with you for ostomy teaching.  VAC change scheduled for tom, will arrange for [facility] and ostomy teaching at the same time.  Giulio Bertino [place] RN,CWOCN [id number]

## 2015-07-12 LAB — CBC
HCT: 34.5 % — ABNORMAL LOW (ref 36.0–46.0)
Hemoglobin: 11.8 g/dL — ABNORMAL LOW (ref 12.0–15.0)
MCH: 32.6 pg (ref 26.0–34.0)
MCHC: 34.2 g/dL (ref 30.0–36.0)
MCV: 95.3 fL (ref 78.0–100.0)
PLATELETS: 285 10*3/uL (ref 150–400)
RBC: 3.62 MIL/uL — ABNORMAL LOW (ref 3.87–5.11)
RDW: 12.8 % (ref 11.5–15.5)
WBC: 10.1 10*3/uL (ref 4.0–10.5)

## 2015-07-12 LAB — BASIC METABOLIC PANEL
Anion gap: 10 (ref 5–15)
CALCIUM: 8.4 mg/dL — AB (ref 8.9–10.3)
CO2: 26 mmol/L (ref 22–32)
CREATININE: 0.65 mg/dL (ref 0.44–1.00)
Chloride: 101 mmol/L (ref 101–111)
GFR calc Af Amer: >60 mL/min (ref >60)
Glucose, Bld: 143 mg/dL — ABNORMAL HIGH (ref 65–99)
Potassium: 3.5 mmol/L (ref 3.5–5.1)
SODIUM: 137 mmol/L (ref 135–145)

## 2015-07-12 MED ORDER — METHOCARBAMOL 500 MG PO TABS
500.0000 mg | ORAL_TABLET | Freq: Three times a day (TID) | ORAL | Status: DC | PRN
  Administered 2015-07-12 – 2015-07-13 (×2): 500 mg via ORAL
  Filled 2015-07-12 (×2): qty 1

## 2015-07-12 MED ORDER — HYDROMORPHONE HCL 1 MG/ML IJ SOLN
1.0000 mg | INTRAMUSCULAR | Status: DC | PRN
  Administered 2015-07-12: 1 mg via INTRAVENOUS
  Filled 2015-07-12: qty 1

## 2015-07-12 MED ORDER — OXYCODONE HCL 5 MG PO TABS
10.0000 mg | ORAL_TABLET | ORAL | Status: DC | PRN
  Administered 2015-07-12 – 2015-07-15 (×14): 10 mg via ORAL
  Filled 2015-07-12 (×14): qty 2

## 2015-07-12 NOTE — Care Management Note (Addendum)
Case Management Note  Patient Details  Name: Shannon [name] MRN: [id number] Date of Birth: 03/24/1951  Subjective/Objective:                    Action/Plan:   Expected Discharge Date:                  Expected Discharge Plan:  [place]  In-House Referral:     Discharge planning Services     Post Acute Care Choice:    Choice offered to:     DME Arranged:  Walker rolling, Vac DME Agency:  [place] [employer]  HH Arranged:   RN [facility] Agency:   [facility]   Status of Service:  In process, will continue to follow  Medicare Important Message Given:    Date Medicare IM Given:    Medicare IM give by:    Date Additional Medicare IM Given:    Additional Medicare Important Message give by:     If discussed at [place] of Stay Meetings, dates discussed:  07-12-15  Additional Comments:  Marilu Favre, RN 07/12/2015, 10:39 AM

## 2015-07-12 NOTE — Consult Note (Addendum)
WOC wound follow up Wound type: Consult performed for Vac dressing change Measurement: 14X4X3cm Wound bed: Beefy red Drainage (amount, consistency, odor) Small amt pink drainage, no odor Periwound: Intact skin surrounding Dressing procedure/placement/frequency: Applied one piece black sponge to 16mm cont suction.  Pt tolerated with mod amt discomfort after meds given earlier.  Plan for dressing change to be performed on Thurs.  WOC ostomy follow up Stoma type/location:  Stoma to LLQ; teaching session and pouch change performed.  Pouch will need to be changed with every Vac dressing change related to the close proximity; it is difficult to avoid overlapping of the pouch with the Vac drape. Stomal assessment/size: Stoma red and viable, flush with skin level, 1 inch and slightly oval Peristomal assessment:  Intact skin surrounding Output: No stool or flatus, small amt brown drainage Ostomy pouching: 1pc. with barrier ring  Education provided:  Demonstrated pouch change using one piece pouch with barrier ring to maintain seal.  Pt was able to open and close velcro to empty.  Reviewed pouch changing routines.  [place] team will continue to follow for further teaching sessions. Julien Girt MSN, RN, [place], [place], [place]

## 2015-07-12 NOTE — Progress Notes (Signed)
5 Days Post-Op  Subjective: Bowel function, tol clears, no n/v complaining about variety of things not medical  Objective: Vital signs in last 24 hours: Temp:  [98.2 F (36.8 C)-98.7 F (37.1 C)] 98.7 F (37.1 C) (04/04 0857) Pulse Rate:  [79-95] 95 (04/04 0857) Resp:  [14-29] 16 (04/04 0857) BP: (144-179)/(59-99) 165/99 mmHg (04/04 0857) SpO2:  [93 %-98 %] 97 % (04/04 0857) FiO2 (%):  [96 %-98 %] 98 % (04/04 0515) Last BM Date: 07/10/15  Intake/Output from previous day: 04/03 0701 - 04/04 0700 In: 2238.8 [P.O.:720; I.V.:1318.8; IV Piggyback:200] Out: 4615 [Urine:4600; Drains:15] Intake/Output this shift:    General appearance: no distress Resp: clear to auscultation bilaterally Cardio: regular rate and rhythm GI: soft vac in place ostomy pink and bag with air approp tender  Lab Results:   Recent Labs  07/10/15 0633 07/12/15 0440  WBC 11.0* 10.1  HGB 12.3 11.8*  HCT 36.5 34.5*  PLT 320 285   BMET  Recent Labs  07/10/15 0633 07/12/15 0440  NA 135 137  K 4.2 3.5  CL 99* 101  CO2 23 26  GLUCOSE 57* 143*  BUN 8 <5*  CREATININE 0.71 0.65  CALCIUM 8.5* 8.4*   PT/INR No results for input(s): LABPROT, INR in the last 72 hours. ABG No results for input(s): PHART, HCO3 in the last 72 hours.  Invalid input(s): PCO2, PO2  Studies/Results: No results found.  Anti-infectives: Anti-infectives    Start     Dose/Rate Route Frequency Ordered Stop   07/06/15 2200  piperacillin-tazobactam (ZOSYN) IVPB 3.375 g     3.375 g 12.5 mL/hr over 240 Minutes Intravenous 3 times per day 07/06/15 1955        Assessment/Plan: Pod 5 hartmanns  1. Dc pca, oral pain meds with iv backup 2. pulm toilet, oob, pt working with her, dc plan is home with home health 3. Fulls, adat 4. Vac changes as scheduled 5. Lovenox, scds 6. Cont abx for 7 day course  [facility] 07/12/2015

## 2015-07-13 LAB — CREATININE, SERUM
Creatinine, Ser: 0.63 mg/dL (ref 0.44–1.00)
GFR calc non Af Amer: >60 mL/min (ref >60)

## 2015-07-13 MED ORDER — ALUM & MAG HYDROXIDE-SIMETH 200-200-20 MG/5ML PO SUSP
30.0000 mL | Freq: Four times a day (QID) | ORAL | Status: DC | PRN
Start: 2015-07-13 — End: 2015-07-15
  Administered 2015-07-14 – 2015-07-15 (×2): 30 mL via ORAL
  Filled 2015-07-13 (×2): qty 30

## 2015-07-13 MED ORDER — METHOCARBAMOL 500 MG PO TABS
500.0000 mg | ORAL_TABLET | Freq: Three times a day (TID) | ORAL | Status: DC
  Administered 2015-07-13 – 2015-07-15 (×7): 500 mg via ORAL
  Filled 2015-07-13 (×7): qty 1

## 2015-07-13 NOTE — Progress Notes (Signed)
Physical Therapy Treatment Patient Details Name: Shannon [name] MRN: [id number] DOB: 1950-07-23 Today's Date: 07/13/2015    History of Present Illness 65 y.o. female admitted with Perforated acute diverticulitis and now s/p COLECTOMY WITH COLOSTOMY CREATION/HARTMANN PROCEDURE. PMH: direrticulitis, breast cancer, arthritis.     PT Comments    Patient is making good progress with PT. Stair training complete.  From a mobility standpoint anticipate patient will be ready for DC home when medically ready.     Follow Up Recommendations  Supervision for mobility/OOB;No PT follow up     Equipment Recommendations  Rolling walker with 5" wheels    Recommendations for Other Services       Precautions / Restrictions Precautions Precautions: Fall Precaution Comments: colostomy, wound vac, NG tube Restrictions Weight Bearing Restrictions: No    Mobility  Bed Mobility               General bed mobility comments: up in bathroom upon arrival   Transfers Overall transfer level: Needs assistance Equipment used: Rolling walker (2 wheeled) Transfers: Sit to/from [facility] Sit to Stand: Supervision Stand pivot transfers: Supervision       General transfer comment: cues for hand placement when descending to recliner  Ambulation/Gait Ambulation/Gait assistance: Supervision Ambulation Distance (Feet): 220 Feet Assistive device: Rolling walker (2 wheeled) Gait Pattern/deviations: Step-through pattern;Decreased stride length;Trunk flexed Gait velocity: very slow   General Gait Details: slow and steady gait; cues for posture and position of RW   Stairs Stairs: Yes Stairs assistance: Min guard (min A for managing lines/drains) Stair Management: No rails;Backwards;With walker Number of Stairs: 3 General stair comments: cues for sequencing and technique with min guard for safety; carry over demonstrated from previous session; assist with management of  lines/drains  Wheelchair Mobility    Modified Rankin (Stroke Patients Only)       Balance     Sitting balance-Leahy Scale: Good     Standing balance support: Single extremity supported Standing balance-Leahy Scale: Poor                      Cognition Arousal/Alertness: Awake/alert Behavior During Therapy: WFL for tasks assessed/performed Overall Cognitive Status: Within Functional Limits for tasks assessed                      Exercises      General Comments        Pertinent Vitals/Pain Pain Assessment: Faces Faces Pain Scale: Hurts a little bit Pain Location: abdomen/incision site  Pain Descriptors / Indicators: Sore Pain Intervention(s): Limited activity within patient's tolerance;Monitored during session;Premedicated before session;Repositioned    Home Living                      Prior Function            PT Goals (current goals can now be found in the care plan section) Acute Rehab PT Goals Patient Stated Goal: to go home PT Goal Formulation: With patient Time For Goal Achievement: 07/22/15 Potential to Achieve Goals: Good Progress towards PT goals: Progressing toward goals    Frequency  Min 3X/week    PT Plan Current plan remains appropriate    Co-evaluation             End of Session Equipment Utilized During Treatment: Gait belt Activity Tolerance: Patient tolerated treatment well Patient left: with call bell/phone within reach;in chair     Time: [id number] PT Time Calculation (min) (  ACUTE ONLY): 33 min  Charges:  $Gait Training: 23-37 mins                    G Codes:      Salina April, PTA Pager: [phone]   07/13/2015, 12:30 PM

## 2015-07-13 NOTE — Progress Notes (Signed)
Patient ID: Shannon [name], female   DOB: 28-Mar-1951, 65 y.o.   MRN: [id number]     [place]      [address] [place], [place] [address]    Phone: [phone] FAX: [phone]     Subjective: Pain okay.  Ostomy functioning. Afebrile. Up in room. Drain output 81m  Objective:  Vital signs:  Filed Vitals:   07/12/15 0751 07/12/15 0857 07/12/15 2028 07/13/15 0530  BP:  165/99 153/63 126/54  Pulse:  95 102 89  Temp:  98.7 F (37.1 C) 98.7 F (37.1 C) 98.9 F (37.2 C)  TempSrc:  Oral Oral Oral  Resp: _0 Height:      Weight:      SpO2: 93% 97% 97% 96%    Last BM Date: 07/10/15  Intake/Output   Yesterday:  04/04 0701 - 04/05 0700 In: 2219.3 [P.O.:1040; I.V.:1079.3; IV Piggyback:100] Out: 2685 [Urine:2670; Drains:15] This shift:    I/O last 3 completed shifts: In: 3905.6 [P.O.:1520; I.V.:2185.6; IV Piggyback:200] Out: 5200 [Urine:5170; Drains:30]    Physical Exam: General: Pt awake/alert/oriented x4 in no acute distress Abdomen: Soft.  Nondistended.   Mildly tender at incisions only. jp drain with serosanguinous output. Vac in place.  llq ostomy is functioning, stool present.  No evidence of peritonitis.  No incarcerated hernias.    Problem List:   Active Problems:   Diverticulitis of sigmoid colon    Results:   Labs: Results for orders placed or performed during the hospital encounter of 07/06/15 (from the past 48 hour(s))  Basic metabolic panel     Status: Abnormal   Collection Time: 07/12/15  4:40 AM  Result Value Ref Range   Sodium 137 135 - 145 mmol/L   Potassium 3.5 3.5 - 5.1 mmol/L   Chloride 101 101 - 111 mmol/L   CO2 26 22 - 32 mmol/L   Glucose, Bld 143 (H) 65 - 99 mg/dL   BUN <5 (L) 6 - 20 mg/dL   Creatinine, Ser 0.65 0.44 - 1.00 mg/dL   Calcium 8.4 (L) 8.9 - 10.3 mg/dL   GFR calc non Af Amer >60 >60 mL/min   GFR calc Af Amer >60 >60 mL/min    Comment: (NOTE) The eGFR  has been calculated using the CKD EPI equation. This calculation has not been validated in all clinical situations. eGFR's persistently <60 mL/min signify possible Chronic Kidney Disease.    Anion gap 10 5 - 15  CBC     Status: Abnormal   Collection Time: 07/12/15  4:40 AM  Result Value Ref Range   WBC 10.1 4.0 - 10.5 K/uL   RBC 3.62 (L) 3.87 - 5.11 MIL/uL   Hemoglobin 11.8 (L) 12.0 - 15.0 g/dL   HCT 34.5 (L) 36.0 - 46.0 %   MCV 95.3 78.0 - 100.0 fL   MCH 32.6 26.0 - 34.0 pg   MCHC 34.2 30.0 - 36.0 g/dL   RDW 12.8 11.5 - 15.5 %   Platelets 285 150 - 400 K/uL  Creatinine, serum     Status: None   Collection Time: 07/13/15  4:46 AM  Result Value Ref Range   Creatinine, Ser 0.63 0.44 - 1.00 mg/dL   GFR calc non Af Amer >60 >60 mL/min   GFR calc Af Amer >60 >60 mL/min    Comment: (NOTE) The eGFR has been calculated using the CKD EPI equation. This calculation has not been validated in all clinical situations.  eGFR's persistently <60 mL/min signify possible Chronic Kidney Disease.     Imaging / Studies: No results found.  Medications / Allergies:  Scheduled Meds: . antiseptic oral rinse  7 mL Mouth Rinse q12n4p  . chlorhexidine  15 mL Mouth Rinse BID  . enoxaparin (LOVENOX) injection  40 mg Subcutaneous Q24H  . methocarbamol  500 mg Oral TID  . piperacillin-tazobactam (ZOSYN)  IV  3.375 g Intravenous 3 times per day  . sodium chloride flush  10-40 mL Intracatheter Q12H   Continuous Infusions: . dextrose 5 % and 0.45 % NaCl with KCl 20 mEq/L 40 mL/hr at 07/12/15 2001   PRN Meds:.diphenhydrAMINE **OR** diphenhydrAMINE, hydrALAZINE, hydrocortisone cream, HYDROmorphone (DILAUDID) injection, menthol-cetylpyridinium, ondansetron **OR** ondansetron (ZOFRAN) IV, oxyCODONE, sodium chloride flush  Antibiotics: Anti-infectives    Start     Dose/Rate Route Frequency Ordered Stop   07/06/15 2200  piperacillin-tazobactam (ZOSYN) IVPB 3.375 g     3.375 g 12.5 mL/hr over 240 Minutes  Intravenous 3 times per day 07/06/15 1955          Assessment/Plan Perforated acute diverticulitis  POD#6 hartmann's---Dr. Hulen Skains  -advance to soft diet, encourage PO pain meds -WOC for colostomy teaching -[place] RN for [facility] and ostomy teaching  -VAC t-th-sat -DC drain prior to discharge  ID-zosyn D# VTE prophylaxis-SCD/lovenox FEN-SLIV Dispo-home 24-48h   Erby Pian, ANP-BC [facility] Pager [phone](7A-4:30P) For consults and floor pages call [phone](7A-4:30P)  07/13/2015 10:24 AM

## 2015-07-14 MED ORDER — MELOXICAM 7.5 MG PO TABS
15.0000 mg | ORAL_TABLET | Freq: Every day | ORAL | Status: DC
  Administered 2015-07-14 – 2015-07-15 (×2): 15 mg via ORAL
  Filled 2015-07-14 (×3): qty 2

## 2015-07-14 MED ORDER — ADULT MULTIVITAMIN W/MINERALS CH
1.0000 | ORAL_TABLET | Freq: Every day | ORAL | Status: DC
  Administered 2015-07-14 – 2015-07-15 (×2): 1 via ORAL
  Filled 2015-07-14 (×2): qty 1

## 2015-07-14 MED ORDER — PANTOPRAZOLE SODIUM 40 MG PO TBEC: 40.0000 mg | DELAYED_RELEASE_TABLET | Freq: Every day | ORAL | Status: DC

## 2015-07-14 NOTE — Consult Note (Signed)
WOC wound consult note Reason for Consult: NPWT VAC dressing change in conjunction with her ostomy teaching session. Wound type: surgical  Measurement: 14cm x 4cm x 3cm  Wound [id number] tissue Drainage (amount, consistency, odor) minimal in canister, serousanginous Periwound:  Intact, very close proximity to stoma in the LLQ Dressing procedure/placement/frequency: 2pc of black foam used to fill the wound bed, seal at 129mmHG. Pt tolerated well.   WOC ostomy follow up Stoma type/location: LLQ, end colostomy Stomal assessment/size: 1" x 1 5/8" oval, pink, moist, flush with dip at 9 oclock  Peristomal assessment: intact  Treatment options for stomal/peristomal skin: using 2" barrier ring to aid in seal with flush stoma Output green, liquid, flatus Ostomy pouching: 1pc with 2" barrier ring Education provided:  Patient removed old pouch, WOC demonstrated cleaning the peristomal skin and stoma. Demonstrated how to use wick to clean the bottom portion of the pouch.  We discussed how to empty either sitting or standing.  She has "burped" the pouch independently.  Patient cut new pouch, after [place] nurse drew pattern from her previous pouch change earlier this week.  Assisted with placing new pouch, I had to cut the medial edge of the tape border away to accommodate [facility] dressing and surgical wound.  Patient practiced opening and closing her pouch with new pouch in place.   Enrolled patient in [place] Start Discharge program: Yes  WOC will follow along with you for support with wound and ostomy care.  Will need HHRN For dressing changes and continued support with ostomy care.   [place], [place]

## 2015-07-14 NOTE — Progress Notes (Signed)
Patient ID: Shannon [name], female   DOB: [birth year], 65 y.o.   MRN: [id number]     [place]      [address] [place], [place] [address]    Phone: [phone] FAX: [phone]     Subjective: C/o dizziness when oob, this am, now better.  No n/v.  Ambulating tolerating POs. 64m drain output.   Objective:  Vital signs:  Filed Vitals:   07/13/15 0530 07/13/15 2330 07/14/15 0545 07/14/15 0948  BP: 126/54 134/79 132/67 120/62  Pulse: 89 92 80 86  Temp: 98.9 F (37.2 C) 98.2 F (36.8 C) 98 F (36.7 C) 98.4 F (36.9 C)  TempSrc: Oral Oral Oral Oral  Resp: _0 Height:      Weight:      SpO2: 96% 96% 97% 97%    Last BM Date: 07/14/15  Intake/Output   Yesterday:  04/05 0701 - 04/06 0700 In: 5656.2 [P.O.:360; I.V.:5096.2; IV Piggyback:200] Out: 450 [Urine:400; Drains:50] This shift:  Total I/O In: -  Out: 500 [Urine:500]   Physical Exam: General: Pt awake/alert/oriented x4 in no acute distress Abdomen: Soft. Nondistended. Mildly tender at incisions only. jp drain with serosanguinous output. Vac in place. llq ostomy is functioning, stool present. No evidence of peritonitis. No incarcerated hernias.    Problem List:   Active Problems:   Diverticulitis of sigmoid colon    Results:   Labs: Results for orders placed or performed during the hospital encounter of 07/06/15 (from the past 48 hour(s))  Creatinine, serum     Status: None   Collection Time: 07/13/15  4:46 AM  Result Value Ref Range   Creatinine, Ser 0.63 0.44 - 1.00 mg/dL   GFR calc non Af Amer >60 >60 mL/min   GFR calc Af Amer >60 >60 mL/min    Comment: (NOTE) The eGFR has been calculated using the CKD EPI equation. This calculation has not been validated in all clinical situations. eGFR's persistently <60 mL/min signify possible Chronic Kidney Disease.     Imaging / Studies: No results found.  Medications / Allergies:   Scheduled Meds: . enoxaparin (LOVENOX) injection  40 mg Subcutaneous Q24H  . meloxicam  15 mg Oral Daily  . methocarbamol  500 mg Oral TID  . multivitamin with minerals  1 tablet Oral Daily  . piperacillin-tazobactam (ZOSYN)  IV  3.375 g Intravenous 3 times per day  . sodium chloride flush  10-40 mL Intracatheter Q12H   Continuous Infusions:  PRN Meds:.alum & mag hydroxide-simeth, diphenhydrAMINE **OR** diphenhydrAMINE, hydrALAZINE, hydrocortisone cream, HYDROmorphone (DILAUDID) injection, menthol-cetylpyridinium, ondansetron **OR** ondansetron (ZOFRAN) IV, oxyCODONE, sodium chloride flush  Antibiotics: Anti-infectives    Start     Dose/Rate Route Frequency Ordered Stop   07/06/15 2200  piperacillin-tazobactam (ZOSYN) IVPB 3.375 g     3.375 g 12.5 mL/hr over 240 Minutes Intravenous 3 times per day 07/06/15 1955         Assessment/Plan Perforated acute diverticulitis  POD#7 hartmann's---Dr. WHulen Skains -tolerating diet, PO pain meds.  -WOC for colostomy teaching -[place] [facility]  -VAC t-th-sat, [facility].  Will evaluate stoma today, looks retracted, but difficult to examine with appliance.  -DC drain prior to discharge  ID-zosyn D#7, stop.  VTE prophylaxis-SCD/lovenox FEN-SLIV Dispo-home tomorrow.  Monitor dizziness today.    EErby Pian ANew[facility] Center[facility] Pager [phone] For consults and floor pages  call [phone](7A-4:30P)  07/14/2015 12:03 PM

## 2015-07-15 LAB — BASIC METABOLIC PANEL
Anion gap: 11 (ref 5–15)
BUN: 8 mg/dL (ref 6–20)
CALCIUM: 8.8 mg/dL — AB (ref 8.9–10.3)
CO2: 26 mmol/L (ref 22–32)
CREATININE: 0.76 mg/dL (ref 0.44–1.00)
Chloride: 100 mmol/L — ABNORMAL LOW (ref 101–111)
Glucose, Bld: 103 mg/dL — ABNORMAL HIGH (ref 65–99)
Potassium: 3.7 mmol/L (ref 3.5–5.1)
SODIUM: 137 mmol/L (ref 135–145)

## 2015-07-15 LAB — CBC
HEMATOCRIT: 35.1 % — AB (ref 36.0–46.0)
HEMOGLOBIN: 11.8 g/dL — AB (ref 12.0–15.0)
MCH: 32.7 pg (ref 26.0–34.0)
MCHC: 33.6 g/dL (ref 30.0–36.0)
MCV: 97.2 fL (ref 78.0–100.0)
PLATELETS: 317 10*3/uL (ref 150–400)
RBC: 3.61 MIL/uL — AB (ref 3.87–5.11)
RDW: 13.2 % (ref 11.5–15.5)
WBC: 9.8 10*3/uL (ref 4.0–10.5)

## 2015-07-15 MED ORDER — OXYCODONE HCL 10 MG PO TABS: 10.0000 mg | ORAL_TABLET | ORAL | Status: DC | PRN

## 2015-07-15 NOTE — Discharge Instructions (Signed)

## 2015-07-15 NOTE — Progress Notes (Signed)
8 Days Post-Op  Subjective: No complaints, not dizzy tol diet  Objective: Vital signs in last 24 hours: Temp:  [98.1 F (36.7 C)-98.5 F (36.9 C)] 98.1 F (36.7 C) (04/07 0503) Pulse Rate:  [86-101] 90 (04/07 0503) Resp:  [14-16] 16 (04/07 0503) BP: (120-148)/(53-67) 125/53 mmHg (04/07 0503) SpO2:  [94 %-100 %] 97 % (04/07 0503) Last BM Date: 07/14/15  Intake/Output from previous day: 04/06 0701 - 04/07 0700 In: 480 [P.O.:480] Out: 1765 [Urine:1750; Drains:15] Intake/Output this shift:    General appearance: no distress Resp: clear to auscultation bilaterally Cardio: regular rate and rhythm GI: drain serous, vac in place, stoma a little retracted but viable and functional, bs present  Lab Results:   Recent Labs  07/15/15 0435  WBC 9.8  HGB 11.8*  HCT 35.1*  PLT 317   BMET  Recent Labs  07/13/15 0446 07/15/15 0435  NA  --  137  K  --  3.7  CL  --  100*  CO2  --  26  GLUCOSE  --  103*  BUN  --  8  CREATININE 0.63 0.76  CALCIUM  --  8.8*   PT/INR No results for input(s): LABPROT, INR in the last 72 hours. ABG No results for input(s): PHART, HCO3 in the last 72 hours.  Invalid input(s): PCO2, PO2  Studies/Results: No results found.  Anti-infectives: Anti-infectives    Start     Dose/Rate Route Frequency Ordered Stop   07/06/15 2200  piperacillin-tazobactam (ZOSYN) IVPB 3.375 g  Status:  Discontinued     3.375 g 12.5 mL/hr over 240 Minutes Intravenous 3 times per day 07/06/15 1955 07/14/15 1206      Assessment/Plan:  Perforated acute diverticulitis  POD#8 hartmann's---Dr. Hulen Skains  -tolerating diet, PO pain meds.  -WOC for colostomy teaching -[place] RN for [facility] and ostomy teaching  -VAC t-th-sat, plan to change to her home [facility] tomorrow and do MWF instead.  -DC drain prior to discharge   VTE prophylaxis-SCD/lovenox FEN-SLIV Dispo-home today  [facility] 07/15/2015

## 2015-07-15 NOTE — Consult Note (Signed)
WOC contacted bedside nurse to make her aware WOC will not be [facility] today.  Proceed with hooking patient to home unit for DC.  If dressing to be changed (was just changed yesterday) will need to change pouch with VAC dressing change as well.  Bedside nurse aware she will need to complete both prior to DC today.  If surgeon agreeable would suggest VAC dressing change on Saturday with ostomy teaching as well per [facility] then change again on Monday to get on M/W/F change schedule for VAC.   [place], [place]

## 2015-07-15 NOTE — Progress Notes (Signed)
PT Cancellation Note  Patient Details Name: Shannon [name] MRN: [id number] DOB: [birth year]   Cancelled Treatment:    Reason Eval/Treat Not Completed: Patient declined, no reason specifiedPt reported she just finished breakfast and needs time to digest her food or she will get nauseous. PT will check on pt later as time allows.    Sal[place] April, PTA Pager: [phone]   07/15/2015, 9:19 AM

## 2015-07-15 NOTE — Progress Notes (Signed)
Patient discharged to home with instructions and equipments. 

## 2015-07-15 NOTE — Progress Notes (Signed)
Physical Therapy Treatment Patient Details Name: Shannon [name] MRN: [id number] DOB: [birth year] Today's Date: 07/15/2015    History of Present Illness 65 y.o. female admitted with Perforated acute diverticulitis and now s/p COLECTOMY WITH COLOSTOMY CREATION/HARTMANN PROCEDURE. PMH: direrticulitis, breast cancer, arthritis.     PT Comments    Patient continues to do well with PT. Reviewed stair training. Current plan remains appropriate.   Follow Up Recommendations  Supervision for mobility/OOB;No PT follow up     Equipment Recommendations  Rolling walker with 5" wheels    Recommendations for Other Services       Precautions / Restrictions Precautions Precautions: Fall Precaution Comments: colostomy, wound vac Restrictions Weight Bearing Restrictions: No    Mobility  Bed Mobility Overal bed mobility: Modified Independent Bed Mobility: Rolling;Sit to Sidelying Rolling: Modified independent (Device/Increase time)       Sit to sidelying: Modified independent (Device/Increase time) General bed mobility comments: carry over of technique; increased time and min use of bedrail required  Transfers Overall transfer level: Needs assistance Equipment used: Rolling walker (2 wheeled);None Transfers: Sit to/from Stand Sit to Stand: Supervision Stand pivot transfers: Supervision       General transfer comment: safe hand placement and technique; increased time needed   Ambulation/Gait Ambulation/Gait assistance: Supervision Ambulation Distance (Feet): 150 Feet Assistive device: Rolling walker (2 wheeled) Gait Pattern/deviations: Step-through pattern;Decreased stride length;Trunk flexed     General Gait Details: cues for posture and position of RW; slow and guarded gait due to tightness/discomfort in lower abdomen with upright posture   Stairs   Stairs assistance: Min guard Stair Management: No rails;Backwards;With walker Number of Stairs: 3 General stair  comments: carry over of technique and sequencing with min cues needed for placement of RW   Wheelchair Mobility    Modified Rankin (Stroke Patients Only)       Balance     Sitting balance-Leahy Scale: Good     Standing balance support: Single extremity supported Standing balance-Leahy Scale: Fair                      Cognition Arousal/Alertness: Awake/alert Behavior During Therapy: WFL for tasks assessed/performed Overall Cognitive Status: Within Functional Limits for tasks assessed                      Exercises      General Comments        Pertinent Vitals/Pain Pain Assessment: Faces Faces Pain Scale: Hurts a little bit Pain Location: abdomen Pain Descriptors / Indicators: Discomfort;Guarding Pain Intervention(s): Limited activity within patient's tolerance;Monitored during session;Premedicated before session;Repositioned    Home Living                      Prior Function            PT Goals (current goals can now be found in the care plan section) Acute Rehab PT Goals Patient Stated Goal: to go home PT Goal Formulation: With patient Time For Goal Achievement: 07/22/15 Potential to Achieve Goals: Good Progress towards PT goals: Progressing toward goals    Frequency  Min 3X/week    PT Plan Current plan remains appropriate    Co-evaluation             End of Session Equipment Utilized During Treatment: Gait belt Activity Tolerance: Patient tolerated treatment well Patient left: with call bell/phone within reach;in bed;with nursing/sitter in room (getting wound vac changed)     Time: [id number]  PT Time Calculation (min) (ACUTE ONLY): 22 min  Charges:  $Gait Training: 8-22 mins                    G Codes:      Sal[place] April, PTA Pager: [phone]   07/15/2015, 1:11 PM

## 2015-07-16 ENCOUNTER — Encounter: Payer: Self-pay | Admitting: General Surgery

## 2015-07-16 NOTE — Progress Notes (Unsigned)
Received a call today from [employer] about her.  She was discharged yesterday and has a wound VAC an colostomy.  Unfortunately, her family is not going to be able to stay with her as much as is needed after the weekend and she feels she will needed to go to a SNF or other assisted living facility.  It sound like between the [facility] and a family member staying with her at night during this weekend, she will be able to get through until Monday but not beyond.  I will inform my office staff to call her and start working on more home assistance or placement on Monday.

## 2015-07-19 NOTE — Discharge Summary (Signed)
Physician Discharge Summary  MYKISHA LUMIA [id number] DOB: [birth year] DOA: 07/06/2015  PCP: Mayra Neer, MD  Consultation:  WOC  Admit date: 07/06/2015 Discharge date: 07/19/2015  Recommendations for Outpatient Follow-up:   Follow-up Information    Follow up with Judeth Horn, MD In 2 weeks.   Specialty:  General Surgery   Why:  For wound re-check   Contact information:   [place] [address] [phone]      Discharge Diagnoses:  1. Perforated diverticulitis    Surgical Procedure: hartmann's---Dr. Hulen Skains   Discharge Condition: stable Disposition: home  Diet recommendation: regular  Filed Weights   07/06/15 1952 07/07/15 1635  Weight: 84.006 kg (185 lb 3.2 oz) 93.26 kg (205 lb 9.6 oz)       Hospital Course:  Shannon [name] is a 65 year old female with a history of breast cancer who was admitted to [facility] 12/16 with sigmoid diverticulitis.  She was discharged with antibiotics and had a colonoscopy 06/17/15 by Dr. Loletha Carrow which showed some small mouthed diverticula.  She subsequently developed abdominal pain.  She was started on Augmentin and had a CT scan which showed perforated viscus.  She was admitted and underwent to procedure listed above.  She tolerated the procedure well and was transferred to the floor.  Diet was advanced and PCA was weaned off as ostomy functioned.  She was mobilized with therapies and nursing, but needed lots of encouragement.  The patient was maintained on SCDs and lovenox.   WOC was consulted for [facility] changes and ostomy teaching.  On POD#7 she was felt stable for discharge with [facility] changes and continued ostomy teaching.  Medication risks, benefits and therapeutic alternatives were reviewed with the patient.  She verbalizes understanding.  She was encouraged to call with questions or concerns.     Discharge Instructions  Discharge Instructions    PICC line removal    Complete by:  As directed              Medication List    TAKE these medications        alum & mag hydroxide-simeth 200-200-20 MG/5ML suspension  Commonly known as:  MAALOX/MYLANTA  Take 15 mLs by mouth every 4 (four) hours as needed for indigestion or heartburn.     aspirin-acetaminophen-caffeine 250-250-65 MG tablet  Commonly known as:  EXCEDRIN MIGRAINE  Take 1 tablet by mouth every 6 (six) hours as needed for headache.     CALCIUM PO  Take 1 tablet by mouth daily.     cetirizine 10 MG tablet  Commonly known as:  ZYRTEC  Take 10 mg by mouth daily as needed for allergies.     FISH OIL PO  Take 1 tablet by mouth daily.     meloxicam 15 MG tablet  Commonly known as:  MOBIC  Take 15 mg by mouth daily.     multivitamin with minerals Tabs tablet  Take 1 tablet by mouth daily.     omeprazole 40 MG capsule  Commonly known as:  PRILOSEC  Take 40 mg by mouth at bedtime.     Oxycodone HCl 10 MG Tabs  Take 1 tablet (10 mg total) by mouth every 4 (four) hours as needed for moderate pain.     SYSTANE OP  Apply 1-2 drops to eye daily as needed (dry eyes).           Follow-up Information    Follow up with Judeth Horn, MD In 2  weeks.   Specialty:  General Surgery   Why:  For wound re-check   Contact information:   [place] [address] [phone]        The results of significant diagnostics from this hospitalization (including imaging, microbiology, ancillary and laboratory) are listed below for reference.    Significant Diagnostic Studies: Ct Abdomen Pelvis W Contrast  07/06/2015  CLINICAL DATA:  Rectosigmoid colon diverticulitis EXAM: CT ABDOMEN AND PELVIS WITH CONTRAST TECHNIQUE: Multidetector CT imaging of the abdomen and pelvis was performed using the standard protocol following bolus administration of intravenous contrast. CONTRAST:  100 cc Isovue COMPARISON:  03/29/2015 FINDINGS: Lower chest: Lung bases shows mild posterior atelectasis. Bilateral partially  visualized breast implants. Hepatobiliary: Stable small low-density cysts or biliary hamartomas within liver. No solid hepatic mass. The patient is status postcholecystectomy. Pancreas: No mass, inflammatory changes, or other significant abnormality. Spleen: Within normal limits in size and appearance. Adrenals/Urinary Tract: No adrenal gland mass. Enhanced kidneys are symmetrical in size. No hydronephrosis or hydroureter. The urinary bladder is under distended. No calcified calculi are noted within urinary bladder. Delayed renal images shows bilateral renal symmetrical excretion. There is a cyst in midpole of the left kidney measures 1.5 cm. Stomach/Bowel: Small free air is noted in axial image 1 anterior and lateral to the liver consistent with perforated viscus. There are dilated small bowel loops in left upper abdomen and left lower abdomen. There is transition point in caliber of small bowel in mid pelvis in axial image 57. Findings are consistent with partial small bowel obstruction or significant ileus. There is thickening of the wall of small bowel loops in mid pelvis please see axial image 61. There is a mesenteric abscess adjacent to small bowel loops and axial image 62 measures 3.2 by 3.3 cm. The sigmoid colon is decompressed. There is mild stranding of the pericolonic fat. Findings probable consistent with perforated diverticulitis. Axial image 66 there is a tubular tract between anterior or sigmoid colon and the abscess in mid pelvis. Findings highly suspicious for a fistulous tract. Small collection just adjacent to sigmoid colon within pelvis axial image 67 measures 1.4 cm. Some fluid noted in right colon. The descending colon is decompressed. Some fluid noted within a decompressed descending colon. Axial image 62 normal appendix is partially visualized. Vascular/Lymphatic: No significant retroperitoneal or mesenteric adenopathy. No aortic aneurysm. Reproductive: The patient is status post  hysterectomy. Other: No inguinal adenopathy. Musculoskeletal: Sagittal images of the spine shows degenerative changes lumbar spine. IMPRESSION: 1. There is small amount of free abdominal air adjacent and anterior to the liver consistent with perforated viscus. Distended small bowel loops are noted in left mid and lower abdomen. There is transition point in caliber of small bowel and mild thickening of distal small bowel loops. Findings highly suspicious for significant ileus or partial small bowel obstruction. Small amount of free fluid adjacent to distal small bowel loops. There is a collection adjacent to cluster small bowel loops in mid pelvis measures 3.3 x 3.2 cm highly suspicious for mesenteric abscess. Small amount of extraluminal air see axial image 59. There is a tubular connection with a small collection adjacent to sigmoid colon. Findings consistent with perforated sigmoid diverticulitis with small pericolonic abscess and fistulous tract. 2. The distal colon is empty decompressed. 3. No hydronephrosis or hydroureter. 4. Status post hysterectomy. These results were called by telephone at the time of interpretation on 07/06/2015 at 6:43 pm to Dr. Jimmy Footman, who verbally acknowledged these  results. Electronically Signed   By: Lahoma Crocker M.D.   On: 07/06/2015 18:45    Microbiology: No results found for this or any previous visit (from the past 240 hour(s)).   Labs: Basic Metabolic Panel:  Recent Labs Lab 07/13/15 0446 07/15/15 0435  NA  --  137  K  --  3.7  CL  --  100*  CO2  --  26  GLUCOSE  --  103*  BUN  --  8  CREATININE 0.63 0.76  CALCIUM  --  8.8*   Liver Function Tests: No results for input(s): AST, ALT, ALKPHOS, BILITOT, PROT, ALBUMIN in the last 168 hours. No results for input(s): LIPASE, AMYLASE in the last 168 hours. No results for input(s): AMMONIA in the last 168 hours. CBC:  Recent Labs Lab 07/15/15 0435  WBC 9.8  HGB 11.8*  HCT 35.1*  MCV 97.2  PLT 317    Cardiac Enzymes: No results for input(s): CKTOTAL, CKMB, CKMBINDEX, TROPONINI in the last 168 hours. BNP: BNP (last 3 results) No results for input(s): BNP in the last 8760 hours.  ProBNP (last 3 results) No results for input(s): PROBNP in the last 8760 hours.  CBG: No results for input(s): GLUCAP in the last 168 hours.  Active Problems:   Diverticulitis of sigmoid colon   Time coordinating discharge: <30 min  Signed:  Liam Cammarata, ANP-BC

## 2015-07-20 NOTE — Anesthesia Postprocedure Evaluation (Signed)
Anesthesia Post Note  Patient: Shannon [name]  Procedure(s) Performed: Procedure(s) (LRB): COLECTOMY WITH COLOSTOMY CREATION/HARTMANN PROCEDURE (N/A) COLOSTOMY (Left)  Anesthesia Post Evaluation  Last Vitals:  Filed Vitals:   07/15/15 1353 07/15/15 1830  BP: 141/74 154/68  Pulse: 84 85  Temp: 36.8 C   Resp: 16 17    Last Pain:  Filed Vitals:   07/15/15 1846  PainSc: 2                  Shannon [name]

## 2015-08-08 ENCOUNTER — Ambulatory Visit: Payer: BLUE CROSS/BLUE SHIELD | Admitting: Gastroenterology

## 2015-08-12 ENCOUNTER — Other Ambulatory Visit (HOSPITAL_COMMUNITY)
Admission: RE | Admit: 2015-08-12 | Discharge: 2015-08-12 | Disposition: A | Discharge status: Home / Self Care | Payer: BLUE CROSS/BLUE SHIELD | Source: Other Acute Inpatient Hospital | Attending: Family Medicine | Admitting: Family Medicine

## 2015-08-12 DIAGNOSIS — N390 Urinary tract infection, site not specified: Secondary | ICD-10-CM | POA: Diagnosis present

## 2015-08-12 DIAGNOSIS — R300 Dysuria: Secondary | ICD-10-CM | POA: Insufficient documentation

## 2015-08-12 LAB — URINALYSIS, ROUTINE W REFLEX MICROSCOPIC
Bilirubin Urine: NEGATIVE
GLUCOSE, UA: NEGATIVE mg/dL
HGB URINE DIPSTICK: NEGATIVE
KETONES UR: NEGATIVE mg/dL
Nitrite: NEGATIVE
PROTEIN: NEGATIVE mg/dL
Specific Gravity, Urine: 1.025 (ref 1.005–1.030)
pH: 5.5 (ref 5.0–8.0)

## 2015-08-12 LAB — URINE MICROSCOPIC-ADD ON: RBC / HPF: NONE SEEN RBC/hpf (ref 0–5)

## 2015-08-14 LAB — URINE CULTURE

## 2015-10-07 ENCOUNTER — Ambulatory Visit: Payer: Self-pay | Admitting: General Surgery

## 2015-10-10 ENCOUNTER — Other Ambulatory Visit: Payer: Self-pay | Admitting: General Surgery

## 2015-10-10 DIAGNOSIS — Z933 Colostomy status: Secondary | ICD-10-CM

## 2015-10-13 ENCOUNTER — Inpatient Hospital Stay: Admission: RE | Admit: 2015-10-13 | Payer: BLUE CROSS/BLUE SHIELD | Source: Ambulatory Visit

## 2015-10-14 ENCOUNTER — Ambulatory Visit
Admission: RE | Admit: 2015-10-14 | Discharge: 2015-10-14 | Disposition: A | Discharge status: Home / Self Care | Payer: BLUE CROSS/BLUE SHIELD | Source: Ambulatory Visit | Attending: General Surgery | Admitting: General Surgery

## 2015-10-14 DIAGNOSIS — Z933 Colostomy status: Secondary | ICD-10-CM

## 2015-10-25 ENCOUNTER — Ambulatory Visit: Payer: Self-pay | Admitting: General Surgery

## 2015-12-13 ENCOUNTER — Encounter (HOSPITAL_COMMUNITY): Payer: Self-pay

## 2015-12-13 ENCOUNTER — Encounter (HOSPITAL_COMMUNITY)
Admission: RE | Admit: 2015-12-13 | Discharge: 2015-12-13 | Disposition: A | Discharge status: Home / Self Care | Payer: BLUE CROSS/BLUE SHIELD | Source: Ambulatory Visit | Attending: General Surgery | Admitting: General Surgery

## 2015-12-13 DIAGNOSIS — Z01812 Encounter for preprocedural laboratory examination: Secondary | ICD-10-CM | POA: Insufficient documentation

## 2015-12-13 HISTORY — DX: Headache: R51

## 2015-12-13 HISTORY — DX: Headache, unspecified: R51.9

## 2015-12-13 LAB — CBC WITH DIFFERENTIAL/PLATELET
BASOS ABS: 0 10*3/uL (ref 0.0–0.1)
BASOS PCT: 1 %
EOS ABS: 0.2 10*3/uL (ref 0.0–0.7)
Eosinophils Relative: 3 %
HEMATOCRIT: 45 % (ref 36.0–46.0)
HEMOGLOBIN: 14.8 g/dL (ref 12.0–15.0)
Lymphocytes Relative: 36 %
Lymphs Abs: 2.2 10*3/uL (ref 0.7–4.0)
MCH: 31.9 pg (ref 26.0–34.0)
MCHC: 32.9 g/dL (ref 30.0–36.0)
MCV: 97 fL (ref 78.0–100.0)
MONOS PCT: 9 %
Monocytes Absolute: 0.5 10*3/uL (ref 0.1–1.0)
NEUTROS ABS: 3.1 10*3/uL (ref 1.7–7.7)
NEUTROS PCT: 51 %
Platelets: 250 10*3/uL (ref 150–400)
RBC: 4.64 MIL/uL (ref 3.87–5.11)
RDW: 13.4 % (ref 11.5–15.5)
WBC: 6.1 10*3/uL (ref 4.0–10.5)

## 2015-12-13 LAB — ABO/RH: ABO/RH(D): A POS

## 2015-12-13 LAB — BASIC METABOLIC PANEL
ANION GAP: 7 (ref 5–15)
BUN: 16 mg/dL (ref 6–20)
CALCIUM: 9.5 mg/dL (ref 8.9–10.3)
CO2: 21 mmol/L — ABNORMAL LOW (ref 22–32)
CREATININE: 0.83 mg/dL (ref 0.44–1.00)
Chloride: 108 mmol/L (ref 101–111)
Glucose, Bld: 180 mg/dL — ABNORMAL HIGH (ref 65–99)
Potassium: 3.6 mmol/L (ref 3.5–5.1)
SODIUM: 136 mmol/L (ref 135–145)

## 2015-12-13 LAB — TYPE AND SCREEN
ABO/RH(D): A POS
Antibody Screen: NEGATIVE

## 2015-12-13 LAB — SURGICAL PCR SCREEN
MRSA, PCR: POSITIVE — AB
Staphylococcus aureus: POSITIVE — AB

## 2015-12-13 NOTE — Pre-Procedure Instructions (Signed)
Shannon [name]  12/13/2015      [id number] - [address], [place] [address] Phone: [phone] Fax: [phone]    Your procedure is scheduled on Monday, September 11th, 2017.  Report to [facility] Admitting at 5:30 A.M.   Call this number if you have problems the morning of surgery:  [phone]   Remember:  Do not eat food or drink liquids after midnight.   Take these medicines the morning of surgery with A SIP OF WATER: Cetirizine (Zyrtec) if needed.  Stop taking: Excedrin Migraine, Aspirin, NSAIDS, Celecoxib (Celebrex), Aleve, Naproxen, Ibuprofen, Advil, Motrin, BC's, Goody's, Fish oil, all herbal medications, and all vitamins.    Do not wear jewelry, make-up or nail polish.  Do not wear lotions, powders, or perfumes, or deoderant.  Do not shave 48 hours prior to surgery.  Do not bring valuables to the hospital.  [facility] is not responsible for any belongings or valuables.  Contacts, dentures or bridgework may not be worn into surgery.  Leave your suitcase in the car.  After surgery it may be brought to your room.  For patients admitted to the hospital, discharge time will be determined by your treatment team.  Patients discharged the day of surgery will not be allowed to drive home.   Special instructions:  Preparing for Surgery.   Please read over the following fact sheets that you were given. MRSA Information, Colon Bowel Prep   [facility]- Preparing For Surgery  Before surgery, you can play an important role. Because skin is not sterile, your skin needs to be as free of germs as possible. You can reduce the number of germs on your skin by washing with CHG (chlorahexidine gluconate) Soap before surgery.  CHG is an antiseptic cleaner which kills germs and bonds with the skin to continue killing germs even after washing.  Please do not  use if you have an allergy to CHG or antibacterial soaps. If your skin becomes reddened/irritated stop using the CHG.  Do not shave (including legs and underarms) for at least 48 hours prior to first CHG shower. It is OK to shave your face.  Please follow these instructions carefully.   1. Shower the NIGHT BEFORE SURGERY and the MORNING OF SURGERY with CHG.   2. If you chose to wash your hair, wash your hair first as usual with your normal shampoo.  3. After you shampoo, rinse your hair and body thoroughly to remove the shampoo.  4. Use CHG as you would any other liquid soap. You can apply CHG directly to the skin and wash gently with a scrungie or a clean washcloth.   5. Apply the CHG Soap to your body ONLY FROM THE NECK DOWN.  Do not use on open wounds or open sores. Avoid contact with your eyes, ears, mouth and genitals (private parts). Wash genitals (private parts) with your normal soap.  6. Wash thoroughly, paying special attention to the area where your surgery will be performed.  7. Thoroughly rinse your body with warm water from the neck down.  8. DO NOT shower/wash with your normal soap after using and rinsing off the CHG Soap.  9. Pat yourself dry with a CLEAN TOWEL.   10. Wear CLEAN PAJAMAS   11. Place CLEAN SHEETS on your bed the night of your first shower and DO NOT SLEEP  WITH PETS.  Day of Surgery: Do not apply any deodorants/lotions. Please wear clean clothes to the hospital/surgery center.

## 2015-12-13 NOTE — Progress Notes (Signed)
Attempted to reach patient to inform of positive PCR result.  Left message for patient.  Prescription called to [facility] on [address].

## 2015-12-13 NOTE — Progress Notes (Signed)
PCP - Dr. Mayra Neer Cardiologist - denies but states that she saw Dr. Wynonia Lawman over 10 years ago for a stress test that was normal; stress test was requested for everyone in the family due to brother's heart history  Echo/cardiac cath - denies  Patient denies chest pain and shortness of breath at PAT appointment.  Patient informed nurse that colon prep had been explained to her by Dr. Richarda Blade office.

## 2015-12-14 LAB — HEMOGLOBIN A1C
HEMOGLOBIN A1C: 5.6 % (ref 4.8–5.6)
Mean Plasma Glucose: 114 mg/dL

## 2015-12-16 MED ORDER — DEXTROSE 5 % IV SOLN
2.0000 g | INTRAVENOUS | Status: AC
  Administered 2015-12-19: 2 g via INTRAVENOUS
  Filled 2015-12-16: qty 2

## 2015-12-19 ENCOUNTER — Inpatient Hospital Stay (HOSPITAL_COMMUNITY): Payer: BLUE CROSS/BLUE SHIELD | Admitting: Anesthesiology

## 2015-12-19 ENCOUNTER — Encounter (HOSPITAL_COMMUNITY): Payer: Self-pay | Admitting: *Deleted

## 2015-12-19 ENCOUNTER — Encounter (HOSPITAL_COMMUNITY)
Admission: RE | Disposition: A | Discharge status: Home / Self Care | Payer: Self-pay | Source: Ambulatory Visit | Attending: General Surgery

## 2015-12-19 ENCOUNTER — Inpatient Hospital Stay (HOSPITAL_COMMUNITY)
Admission: RE | Admit: 2015-12-19 | Discharge: 2015-12-23 | DRG: 331 | Disposition: A | Discharge status: Home / Self Care | Payer: BLUE CROSS/BLUE SHIELD | Source: Ambulatory Visit | Attending: General Surgery | Admitting: General Surgery

## 2015-12-19 DIAGNOSIS — Z885 Allergy status to narcotic agent status: Secondary | ICD-10-CM | POA: Diagnosis not present

## 2015-12-19 DIAGNOSIS — R509 Fever, unspecified: Secondary | ICD-10-CM | POA: Diagnosis not present

## 2015-12-19 DIAGNOSIS — Z433 Encounter for attention to colostomy: Secondary | ICD-10-CM | POA: Diagnosis present

## 2015-12-19 DIAGNOSIS — Z7982 Long term (current) use of aspirin: Secondary | ICD-10-CM | POA: Diagnosis not present

## 2015-12-19 DIAGNOSIS — Z853 Personal history of malignant neoplasm of breast: Secondary | ICD-10-CM

## 2015-12-19 DIAGNOSIS — Z87891 Personal history of nicotine dependence: Secondary | ICD-10-CM

## 2015-12-19 DIAGNOSIS — Z86010 Personal history of colonic polyps: Secondary | ICD-10-CM

## 2015-12-19 DIAGNOSIS — Z9011 Acquired absence of right breast and nipple: Secondary | ICD-10-CM | POA: Diagnosis not present

## 2015-12-19 DIAGNOSIS — Z79899 Other long term (current) drug therapy: Secondary | ICD-10-CM

## 2015-12-19 DIAGNOSIS — Z933 Colostomy status: Secondary | ICD-10-CM

## 2015-12-19 HISTORY — PX: COLOSTOMY REVERSAL: SHX5782

## 2015-12-19 SURGERY — COLOSTOMY REVERSAL
Anesthesia: General | Site: Abdomen

## 2015-12-19 MED ORDER — CEFOTETAN DISODIUM 2 G IJ SOLR
2.0000 g | Freq: Two times a day (BID) | INTRAMUSCULAR | Status: AC
  Administered 2015-12-19: 2 g via INTRAVENOUS
  Filled 2015-12-19: qty 2

## 2015-12-19 MED ORDER — TRAMADOL-ACETAMINOPHEN 37.5-325 MG PO TABS: 1.0000 | ORAL_TABLET | Freq: Four times a day (QID) | ORAL | Status: DC | PRN

## 2015-12-19 MED ORDER — PANTOPRAZOLE SODIUM 40 MG PO TBEC: 40.0000 mg | DELAYED_RELEASE_TABLET | Freq: Every day | ORAL | Status: DC

## 2015-12-19 MED ORDER — SUGAMMADEX SODIUM 200 MG/2ML IV SOLN
INTRAVENOUS | Status: AC
  Filled 2015-12-19: qty 2

## 2015-12-19 MED ORDER — MIDAZOLAM HCL 2 MG/2ML IJ SOLN
INTRAMUSCULAR | Status: AC
  Filled 2015-12-19: qty 2

## 2015-12-19 MED ORDER — LACTATED RINGERS IV SOLN
INTRAVENOUS | Status: DC | PRN
  Administered 2015-12-19 (×2): via INTRAVENOUS

## 2015-12-19 MED ORDER — POVIDONE-IODINE 10 % EX OINT
TOPICAL_OINTMENT | CUTANEOUS | Status: DC | PRN
Start: 2015-12-19 — End: 2015-12-19
  Administered 2015-12-19: 1 via TOPICAL

## 2015-12-19 MED ORDER — MIDAZOLAM HCL 5 MG/5ML IJ SOLN
INTRAMUSCULAR | Status: DC | PRN
  Administered 2015-12-19 (×2): 2 mg via INTRAVENOUS

## 2015-12-19 MED ORDER — ALVIMOPAN 12 MG PO CAPS
12.0000 mg | ORAL_CAPSULE | Freq: Once | ORAL | Status: AC
  Administered 2015-12-19: 12 mg via ORAL

## 2015-12-19 MED ORDER — FENTANYL CITRATE (PF) 100 MCG/2ML IJ SOLN
INTRAMUSCULAR | Status: AC
  Filled 2015-12-19: qty 2

## 2015-12-19 MED ORDER — FENTANYL CITRATE (PF) 100 MCG/2ML IJ SOLN
INTRAMUSCULAR | Status: DC | PRN
  Administered 2015-12-19 (×2): 50 ug via INTRAVENOUS
  Administered 2015-12-19: 150 ug via INTRAVENOUS
  Administered 2015-12-19: 50 ug via INTRAVENOUS

## 2015-12-19 MED ORDER — ALUM & MAG HYDROXIDE-SIMETH 200-200-20 MG/5ML PO SUSP: 15.0000 mL | ORAL | Status: DC | PRN

## 2015-12-19 MED ORDER — ONDANSETRON HCL 4 MG/2ML IJ SOLN
INTRAMUSCULAR | Status: DC | PRN
  Administered 2015-12-19: 4 mg via INTRAVENOUS

## 2015-12-19 MED ORDER — SODIUM CHLORIDE 0.9% FLUSH: 9.0000 mL | INTRAVENOUS | Status: DC | PRN

## 2015-12-19 MED ORDER — CEFOTETAN DISODIUM-DEXTROSE 2-2.08 GM-% IV SOLR
INTRAVENOUS | Status: AC
  Filled 2015-12-19: qty 50

## 2015-12-19 MED ORDER — SODIUM CHLORIDE 0.9% FLUSH
10.0000 mL | INTRAVENOUS | Status: DC | PRN
  Administered 2015-12-20 – 2015-12-22 (×3): 10 mL
  Filled 2015-12-19 (×3): qty 40

## 2015-12-19 MED ORDER — PROPOFOL 10 MG/ML IV BOLUS
INTRAVENOUS | Status: DC | PRN
  Administered 2015-12-19: 130 mg via INTRAVENOUS

## 2015-12-19 MED ORDER — ONDANSETRON HCL 4 MG/2ML IJ SOLN
INTRAMUSCULAR | Status: AC
  Filled 2015-12-19: qty 4

## 2015-12-19 MED ORDER — MIDAZOLAM HCL 2 MG/2ML IJ SOLN
INTRAMUSCULAR | Status: AC
Start: 2015-12-19 — End: 2015-12-19
  Filled 2015-12-19: qty 2

## 2015-12-19 MED ORDER — LIDOCAINE HCL (CARDIAC) 20 MG/ML IV SOLN
INTRAVENOUS | Status: DC | PRN
  Administered 2015-12-19: 70 mg via INTRAVENOUS
  Administered 2015-12-19: 60 mg via INTRAVENOUS

## 2015-12-19 MED ORDER — POVIDONE-IODINE 10 % EX OINT
TOPICAL_OINTMENT | CUTANEOUS | Status: AC
  Filled 2015-12-19: qty 28.35

## 2015-12-19 MED ORDER — HYDROMORPHONE 1 MG/ML IV SOLN
INTRAVENOUS | Status: DC
  Administered 2015-12-19: 0.6 mg via INTRAVENOUS
  Administered 2015-12-19: 1.6 mg via INTRAVENOUS
  Administered 2015-12-19: 10:00:00 via INTRAVENOUS
  Administered 2015-12-20: 0.4 mg via INTRAVENOUS
  Administered 2015-12-20: 2 mg via INTRAVENOUS
  Administered 2015-12-20: 1 mg via INTRAVENOUS
  Administered 2015-12-21: 0.6 mg via INTRAVENOUS
  Administered 2015-12-21: 1.6 mg via INTRAVENOUS
  Administered 2015-12-21 – 2015-12-22 (×3): 1 mg via INTRAVENOUS

## 2015-12-19 MED ORDER — FENTANYL CITRATE (PF) 100 MCG/2ML IJ SOLN
INTRAMUSCULAR | Status: AC
  Filled 2015-12-19: qty 4

## 2015-12-19 MED ORDER — HYDROMORPHONE HCL 1 MG/ML IJ SOLN
0.2500 mg | INTRAMUSCULAR | Status: DC | PRN
  Administered 2015-12-19 (×2): 0.5 mg via INTRAVENOUS

## 2015-12-19 MED ORDER — ACETAMINOPHEN 500 MG PO TABS
ORAL_TABLET | ORAL | Status: AC
  Filled 2015-12-19: qty 2

## 2015-12-19 MED ORDER — ARTIFICIAL TEARS OP OINT
TOPICAL_OINTMENT | OPHTHALMIC | Status: DC | PRN
  Administered 2015-12-19: 1 via OPHTHALMIC

## 2015-12-19 MED ORDER — LIDOCAINE 2% (20 MG/ML) 5 ML SYRINGE
INTRAMUSCULAR | Status: AC
  Filled 2015-12-19: qty 5

## 2015-12-19 MED ORDER — ACETAMINOPHEN 500 MG PO TABS
1000.0000 mg | ORAL_TABLET | ORAL | Status: AC
  Administered 2015-12-19: 1000 mg via ORAL

## 2015-12-19 MED ORDER — HYDROMORPHONE HCL 1 MG/ML IJ SOLN
INTRAMUSCULAR | Status: AC
  Filled 2015-12-19: qty 1

## 2015-12-19 MED ORDER — DIPHENHYDRAMINE HCL 50 MG/ML IJ SOLN: 12.5000 mg | Freq: Four times a day (QID) | INTRAMUSCULAR | Status: DC | PRN

## 2015-12-19 MED ORDER — SUGAMMADEX SODIUM 200 MG/2ML IV SOLN
INTRAVENOUS | Status: DC | PRN
  Administered 2015-12-19: 200 mg via INTRAVENOUS

## 2015-12-19 MED ORDER — ENOXAPARIN SODIUM 40 MG/0.4ML ~~LOC~~ SOLN
40.0000 mg | SUBCUTANEOUS | Status: DC
  Administered 2015-12-20 – 2015-12-23 (×4): 40 mg via SUBCUTANEOUS
  Filled 2015-12-19 (×4): qty 0.4

## 2015-12-19 MED ORDER — HYDROCODONE-ACETAMINOPHEN 7.5-325 MG PO TABS: 1.0000 | ORAL_TABLET | Freq: Once | ORAL | Status: DC | PRN

## 2015-12-19 MED ORDER — ADULT MULTIVITAMIN W/MINERALS CH
1.0000 | ORAL_TABLET | Freq: Every day | ORAL | Status: DC
  Administered 2015-12-20 – 2015-12-23 (×4): 1 via ORAL
  Filled 2015-12-19 (×4): qty 1

## 2015-12-19 MED ORDER — ROCURONIUM BROMIDE 10 MG/ML (PF) SYRINGE
PREFILLED_SYRINGE | INTRAVENOUS | Status: AC
  Filled 2015-12-19: qty 10

## 2015-12-19 MED ORDER — PHENYLEPHRINE HCL 10 MG/ML IJ SOLN
INTRAVENOUS | Status: DC | PRN
  Administered 2015-12-19: 10 ug/min via INTRAVENOUS

## 2015-12-19 MED ORDER — ALVIMOPAN 12 MG PO CAPS
ORAL_CAPSULE | ORAL | Status: AC
  Filled 2015-12-19: qty 1

## 2015-12-19 MED ORDER — NALOXONE HCL 0.4 MG/ML IJ SOLN: 0.4000 mg | INTRAMUSCULAR | Status: DC | PRN

## 2015-12-19 MED ORDER — CELECOXIB 200 MG PO CAPS
400.0000 mg | ORAL_CAPSULE | Freq: Every day | ORAL | Status: DC
  Administered 2015-12-20 – 2015-12-23 (×4): 400 mg via ORAL
  Filled 2015-12-19: qty 2
  Filled 2015-12-19: qty 1
  Filled 2015-12-19 (×3): qty 2

## 2015-12-19 MED ORDER — DEXAMETHASONE SODIUM PHOSPHATE 10 MG/ML IJ SOLN
INTRAMUSCULAR | Status: AC
  Filled 2015-12-19: qty 1

## 2015-12-19 MED ORDER — DEXAMETHASONE SODIUM PHOSPHATE 10 MG/ML IJ SOLN
INTRAMUSCULAR | Status: DC | PRN
  Administered 2015-12-19: 10 mg via INTRAVENOUS

## 2015-12-19 MED ORDER — HYDROMORPHONE 1 MG/ML IV SOLN
INTRAVENOUS | Status: AC
  Filled 2015-12-19: qty 25

## 2015-12-19 MED ORDER — CHLORHEXIDINE GLUCONATE 4 % EX LIQD: 60.0000 mL | Freq: Once | CUTANEOUS | Status: DC

## 2015-12-19 MED ORDER — KCL IN DEXTROSE-NACL 20-5-0.45 MEQ/L-%-% IV SOLN
INTRAVENOUS | Status: AC
  Filled 2015-12-19: qty 1000

## 2015-12-19 MED ORDER — LIDOCAINE 2% (20 MG/ML) 5 ML SYRINGE
INTRAMUSCULAR | Status: AC
  Filled 2015-12-19: qty 10

## 2015-12-19 MED ORDER — DIPHENHYDRAMINE HCL 12.5 MG/5ML PO ELIX: 12.5000 mg | ORAL_SOLUTION | Freq: Four times a day (QID) | ORAL | Status: DC | PRN

## 2015-12-19 MED ORDER — ROCURONIUM BROMIDE 100 MG/10ML IV SOLN
INTRAVENOUS | Status: DC | PRN
  Administered 2015-12-19: 60 mg via INTRAVENOUS
  Administered 2015-12-19 (×3): 10 mg via INTRAVENOUS

## 2015-12-19 MED ORDER — ONDANSETRON HCL 4 MG/2ML IJ SOLN
4.0000 mg | Freq: Four times a day (QID) | INTRAMUSCULAR | Status: DC | PRN
  Filled 2015-12-19: qty 2

## 2015-12-19 MED ORDER — KCL IN DEXTROSE-NACL 20-5-0.45 MEQ/L-%-% IV SOLN
INTRAVENOUS | Status: DC
  Administered 2015-12-20 – 2015-12-22 (×4): via INTRAVENOUS
  Filled 2015-12-19 (×8): qty 1000

## 2015-12-19 MED ORDER — 0.9 % SODIUM CHLORIDE (POUR BTL) OPTIME
TOPICAL | Status: DC | PRN
  Administered 2015-12-19 (×3): 1000 mL

## 2015-12-19 MED ORDER — ALVIMOPAN 12 MG PO CAPS
12.0000 mg | ORAL_CAPSULE | Freq: Two times a day (BID) | ORAL | Status: DC
  Administered 2015-12-20 – 2015-12-21 (×3): 12 mg via ORAL
  Filled 2015-12-19 (×5): qty 1

## 2015-12-19 MED ORDER — ARTIFICIAL TEARS OP OINT
TOPICAL_OINTMENT | OPHTHALMIC | Status: AC
  Filled 2015-12-19: qty 7

## 2015-12-19 SURGICAL SUPPLY — 60 items
BLADE SURG ROTATE 9660 (MISCELLANEOUS) IMPLANT
CANISTER SUCTION 2500CC (MISCELLANEOUS) ×2 IMPLANT
CHLORAPREP W/TINT 26ML (MISCELLANEOUS) ×2 IMPLANT
COVER MAYO STAND STRL (DRAPES) ×4 IMPLANT
COVER SURGICAL LIGHT HANDLE (MISCELLANEOUS) ×4 IMPLANT
DRAPE LAPAROSCOPIC ABDOMINAL (DRAPES) ×2 IMPLANT
DRAPE PROXIMA HALF (DRAPES) ×4 IMPLANT
DRAPE UTILITY XL STRL (DRAPES) ×10 IMPLANT
DRAPE WARM FLUID 44X44 (DRAPE) ×2 IMPLANT
DRSG OPSITE POSTOP 4X10 (GAUZE/BANDAGES/DRESSINGS) ×2 IMPLANT
DRSG OPSITE POSTOP 4X8 (GAUZE/BANDAGES/DRESSINGS) IMPLANT
DRSG TEGADERM 4X4.75 (GAUZE/BANDAGES/DRESSINGS) ×2 IMPLANT
ELECT BLADE 6.5 EXT (BLADE) ×2 IMPLANT
ELECT CAUTERY BLADE 6.4 (BLADE) ×4 IMPLANT
ELECT REM PT RETURN 9FT ADLT (ELECTROSURGICAL) ×2
ELECTRODE REM PT RTRN 9FT ADLT (ELECTROSURGICAL) ×1 IMPLANT
GAUZE SPONGE 4X4 12PLY STRL (GAUZE/BANDAGES/DRESSINGS) ×2 IMPLANT
GLOVE BIO SURGEON STRL SZ7.5 (GLOVE) ×4 IMPLANT
GLOVE BIOGEL PI IND STRL 6.5 (GLOVE) ×2 IMPLANT
GLOVE BIOGEL PI IND STRL 8 (GLOVE) ×4 IMPLANT
GLOVE BIOGEL PI INDICATOR 6.5 (GLOVE) ×2
GLOVE BIOGEL PI INDICATOR 8 (GLOVE) ×4
GLOVE ECLIPSE 7.5 STRL STRAW (GLOVE) ×6 IMPLANT
GLOVE SURG SS PI 6.5 STRL IVOR (GLOVE) ×4 IMPLANT
GOWN STRL REUS W/ TWL LRG LVL3 (GOWN DISPOSABLE) ×3 IMPLANT
GOWN STRL REUS W/ TWL XL LVL3 (GOWN DISPOSABLE) ×2 IMPLANT
GOWN STRL REUS W/TWL LRG LVL3 (GOWN DISPOSABLE) ×3
GOWN STRL REUS W/TWL XL LVL3 (GOWN DISPOSABLE) ×2
KIT BASIN OR (CUSTOM PROCEDURE TRAY) ×2 IMPLANT
KIT ROOM TURNOVER OR (KITS) ×2 IMPLANT
LEGGING LITHOTOMY PAIR STRL (DRAPES) ×2 IMPLANT
LIGASURE IMPACT 36 18CM CVD LR (INSTRUMENTS) IMPLANT
NS IRRIG 1000ML POUR BTL (IV SOLUTION) ×4 IMPLANT
PACK GENERAL/GYN (CUSTOM PROCEDURE TRAY) ×2 IMPLANT
PAD ARMBOARD 7.5X6 YLW CONV (MISCELLANEOUS) ×2 IMPLANT
PENCIL BUTTON HOLSTER BLD 10FT (ELECTRODE) ×2 IMPLANT
SEPRAFILM PROCEDURAL PACK 3X5 (MISCELLANEOUS) ×2 IMPLANT
SLEEVE SURGEON STRL (DRAPES) ×2 IMPLANT
SPECIMEN JAR MEDIUM (MISCELLANEOUS) IMPLANT
SPONGE LAP 18X18 X RAY DECT (DISPOSABLE) ×4 IMPLANT
STAPLER CIRC CVD 29MM 37CM (STAPLE) ×2 IMPLANT
STAPLER VISISTAT 35W (STAPLE) ×2 IMPLANT
SUCTION POOLE TIP (SUCTIONS) ×2 IMPLANT
SURGILUBE 2OZ TUBE FLIPTOP (MISCELLANEOUS) ×2 IMPLANT
SUT MNCRL AB 3-0 PS2 18 (SUTURE) ×2 IMPLANT
SUT NOVA NAB DX-16 0-1 5-0 T12 (SUTURE) ×4 IMPLANT
SUT PDS AB 1 TP1 96 (SUTURE) ×4 IMPLANT
SUT PROLENE 2 0 CT 1 (SUTURE) ×2 IMPLANT
SUT PROLENE 2 0 KS (SUTURE) ×2 IMPLANT
SUT SILK 2 0 SH (SUTURE) ×2 IMPLANT
SUT SILK 2 0 SH CR/8 (SUTURE) ×2 IMPLANT
SUT SILK 2 0 TIES 10X30 (SUTURE) ×2 IMPLANT
SUT SILK 3 0 SH CR/8 (SUTURE) ×2 IMPLANT
SUT SILK 3 0 TIES 10X30 (SUTURE) ×2 IMPLANT
SYR BULB IRRIGATION 50ML (SYRINGE) ×4 IMPLANT
TOWEL OR 17X26 10 PK STRL BLUE (TOWEL DISPOSABLE) ×2 IMPLANT
TRAY FOLEY CATH 14FRSI W/METER (CATHETERS) ×2 IMPLANT
TRAY PROCTOSCOPIC FIBER OPTIC (SET/KITS/TRAYS/PACK) ×2 IMPLANT
TUBE CONNECTING 12X1/4 (SUCTIONS) ×4 IMPLANT
YANKAUER SUCT BULB TIP NO VENT (SUCTIONS) ×2 IMPLANT

## 2015-12-19 NOTE — Op Note (Signed)
OPERATIVE REPORT  DATE OF OPERATION: 12/19/2015  PATIENT:  Shannon [name]  65 y.o. 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  15 cm distal rectosigmoid segment.  PROCEDURE:  Procedure(s): REVERSAL OF COLOSTOMY RIGID SIGMOIDOSCOPY  SURGEON:  Surgeon(s): Judeth Horn, MD Ralene Ok, MD  ASSISTANT: Rosendo Gros, M.D.  ANESTHESIA:   general  COMPLICATIONS:  None  EBL: 100 ml  BLOOD ADMINISTERED: none  DRAINS: Urinary Catheter (Foley)   SPECIMEN:  Source of Specimen:  Resected sigmoid colon from ostomy site  COUNTS CORRECT:  YES  PROCEDURE DETAILS: The patient was taken to the operating room and placed on the table in the supine position. An adequate general endotracheal anesthetic was administered, she was placed in the lithotomy position for the initial rigid sigmoidoscopy prior to the reversal procedure.  A proper timeout was performed identifying the patient and procedure to be performed. The rigid sigmoidoscope was inserted through the patient's anus up to approximately 15 cm where it seemed to stop with some resistance. There was only retained mucus in the distal rectal stump which was irrigated free with saline and Betadine solution.  Once the rigid sigmoidoscope was removed the patient was subsequent prepped and draped in the usual sterile manner exposing both her perineum and her abdominal wall. The colostomy site was then closed using a running locking stitch of 2-0 silk.  A second timeout was performed identifying the patient and procedure to be performed. Midline incision was made using a #10 blade down into the subcutaneous tissue dettaching the skin from around the thickened midline scar. The patient had a more scarred area on the inferior aspect of her midline incision which had been  tethering the lower incision downward that was excised. We went down to and through the midline incision and once it entered the peritoneal cavity most of the anterior abdominal wall was free of adhesions of small bowel but there was one area where the small bowel had to be detached safely from the anterior abdominal wall.  Remarkably the pelvis was free of most of the adhesions. We were able to place the patient in Trendelenburg position with a Balfour retractor in place identifying the Prolene suture marked rectosigmoid distal segment.  Once this was identified we mobilized it using electrocautery taking care not to injure the mesentery of the distal stump. Once it was adequately mobilize we went ahead and detached the colostomy from the anterior abdominal wall on the left side.  Electrocautery was used to cut around the colostomy site on the left side down into the subcutaneous tissue where we subsequently detached from its surrounding attachments. We were able to pass the colostomy into the peritoneal cavity where we completed the detachment without injuring the colon. The distal 5 cm of the proximal sigmoid colon was taken and resected and sent as a specimen. We initially tried the pursestring suture using the pursestring device and a straight Keith needle however this did not hold up. We subsequently made a pursestring suture using a running 2-0 Prolene suture.  We identified the size of the stapling device and we would need using a dilator up to 29 mm. We subsequently passed the anvil from the 29 mm EEA stapler and secured in place with the pursestring suture with a hemostat attached to the end. The assistant then went down to the peritoneum  and subsequently passed a dilator through the anus up to the proximal portion of the stump and then the stapling device up to the end of the rectosigmoid pouch. Was at that site that the trocar was passed through the colon wall and attached the anvil. Was  subsequently closed in stable releasing the anastomosis. The doughnuts from the anastomoses were complete and there was no air leak once testing under saline with air insufflation.  We irrigated with saline solution. We subsequent a change of gown and gloves and then came back and closed. The colostomy site fascia was closed using interrupted #1 Novafil sutures. We subsequently closed the midline fascia using looped #1 PDS suture after using several pieces of Seprafilm in the peritoneal cavity. The skin was closed using stainless steel staples. There was a buttonhole of the umbilicus which is closed using Monocryl. All needle counts, sponge counts and instrument counts were correct. A sterile dressing was applied all wounds which was stable closed with a honeycomb dressing and Betadine ointment.  PATIENT DISPOSITION:  PACU - hemodynamically stable.   Dimitri Dsouza 9/11/20179:58 AM

## 2015-12-19 NOTE — Anesthesia Procedure Notes (Signed)
Procedure Name: Intubation Date/Time: 12/19/2015 7:40 AM Performed by: Jacquiline Doe A Pre-anesthesia Checklist: Patient identified, Emergency Drugs available, Suction available and Patient being monitored Patient Re-evaluated:Patient Re-evaluated prior to inductionOxygen Delivery Method: Circle System Utilized and Circle system utilized Preoxygenation: Pre-oxygenation with 100% oxygen Intubation Type: IV induction and Cricoid Pressure applied Ventilation: Mask ventilation without difficulty Laryngoscope Size: Mac and 3 Grade View: Grade I Tube type: Oral Tube size: 7.5 mm Number of attempts: 1 Airway Equipment and Method: Stylet and Oral airway Placement Confirmation: ETT inserted through vocal cords under direct vision,  positive ETCO2 and breath sounds checked- equal and bilateral Secured at: 22 cm Tube secured with: Tape Dental Injury: Teeth and Oropharynx as per pre-operative assessment

## 2015-12-19 NOTE — Anesthesia Preprocedure Evaluation (Signed)
Anesthesia Evaluation  Patient identified by MRN, date of birth, ID band Patient awake    Reviewed: Allergy & Precautions, NPO status , Patient's Chart, lab work & pertinent test results  History of Anesthesia Complications Negative for: history of anesthetic complications  Airway Mallampati: II  TM Distance: >3 FB Neck ROM: Full    Dental  (+) Teeth Intact   Pulmonary neg shortness of breath, neg COPD, neg recent URI, former smoker,    breath sounds clear to auscultation       Cardiovascular negative cardio ROS   Rhythm:Regular     Neuro/Psych  Headaches, negative psych ROS   GI/Hepatic Neg liver ROS, GERD  Medicated and Controlled,  Endo/Other  negative endocrine ROS  Renal/GU negative Renal ROS     Musculoskeletal  (+) Arthritis ,   Abdominal   Peds  Hematology negative hematology ROS (+)   Anesthesia Other Findings   Reproductive/Obstetrics                             Anesthesia Physical Anesthesia Plan  ASA: II  Anesthesia Plan: General   Post-op Pain Management:    Induction: Intravenous  Airway Management Planned: Oral ETT  Additional Equipment: None  Intra-op Plan:   Post-operative Plan: Extubation in OR  Informed Consent: I have reviewed the patients History and Physical, chart, labs and discussed the procedure including the risks, benefits and alternatives for the proposed anesthesia with the patient or authorized representative who has indicated his/her understanding and acceptance.   Dental advisory given  Plan Discussed with: CRNA and Surgeon  Anesthesia Plan Comments:         Anesthesia Quick Evaluation

## 2015-12-19 NOTE — Anesthesia Postprocedure Evaluation (Signed)
Anesthesia Post Note  Patient: Shannon [name]  Procedure(s) Performed: Procedure(s) (LRB): REVERSAL OF COLOSTOMY (N/A)  Patient location during evaluation: PACU Anesthesia Type: General Level of consciousness: awake Pain management: pain level controlled Vital Signs Assessment: post-procedure vital signs reviewed and stable Respiratory status: spontaneous breathing Cardiovascular status: stable Postop Assessment: no signs of nausea or vomiting Anesthetic complications: no    Last Vitals:  Vitals:   12/19/15 1200 12/19/15 1230  BP: 122/75 119/66  Pulse: 83   Resp: (!) 8   Temp:      Last Pain:  Vitals:   12/19/15 1230  TempSrc:   PainSc: Asleep                 Marion Seese

## 2015-12-19 NOTE — H&P (Signed)
Shannon [name] is an 66 y.o. female.   Chief Complaint: Colostomy in place.  For reversal HPI: Patient with perforated diverticulitis with infection and abscess.  Hartman's procedure in late March of 2017.  She now presents for reversal.  Past Medical History:  Diagnosis Date  . Allergy   . Arthritis   . Breast cancer ([place])    1991right breast  . Colon polyps   . Diverticulitis   . Gallstones   . GERD (gastroesophageal reflux disease)   . Headache    occasionally  . Obesity     Past Surgical History:  Procedure Laterality Date  . ABDOMINAL HYSTERECTOMY    . CHOLECYSTECTOMY    . COLECTOMY WITH COLOSTOMY CREATION/HARTMANN PROCEDURE N/A 07/07/2015   Procedure: COLECTOMY WITH COLOSTOMY CREATION/HARTMANN PROCEDURE;  Surgeon: Judeth Horn, MD;  Location: [place];  Service: General;  Laterality: N/A;  . COLOSTOMY Left 07/07/2015   Procedure: COLOSTOMY;  Surgeon: Judeth Horn, MD;  Location: [facility];  Service: General;  Laterality: Left;  Marland Kitchen MASTECTOMY Right    with bilateral implants  . TUBAL LIGATION      Family History  Problem Relation Age of Onset  . Dementia Mother   . Skin cancer Father   . Pancreatic cancer Father   . Gastric cancer Maternal Grandmother   . Diverticulitis Paternal Grandmother    Social History:  reports that she quit smoking about 10 years ago. She has never used smokeless tobacco. She reports that she does not drink alcohol or use drugs.  Allergies:  Allergies  Allergen Reactions  . Codeine Nausea Only  . Morphine And Related Anxiety    Taking over body, felt like I was gonna die  . Oxycodone Nausea Only    Medications Prior to Admission  Medication Sig Dispense Refill  . aspirin-acetaminophen-caffeine (EXCEDRIN MIGRAINE) 250-250-65 MG tablet Take 1 tablet by mouth every 6 (six) hours as needed for headache.    Marland Kitchen CALCIUM PO Take 1,200 mg by mouth daily.     . celecoxib (CELEBREX) 400 MG capsule Take 400 mg by mouth daily after breakfast.    .  cetirizine (ZYRTEC) 10 MG tablet Take 10 mg by mouth daily as needed for allergies.    . Multiple Vitamin (MULTIVITAMIN WITH MINERALS) TABS tablet Take 1 tablet by mouth daily.    . Omega-3 Fatty Acids (FISH OIL PO) Take 1,200 mg by mouth daily.     Marland Kitchen omeprazole (PRILOSEC) 40 MG capsule Take 40 mg by mouth at bedtime.    Vladimir Faster Glycol-Propyl Glycol (SYSTANE OP) Apply 1-2 drops to eye daily as needed (dry eyes).    . traMADol-acetaminophen (ULTRACET) 37.5-325 MG tablet Take 1-2 tablets by mouth every 6 (six) hours as needed for moderate pain.    Marland Kitchen alum & mag hydroxide-simeth (MAALOX/MYLANTA) 200-200-20 MG/5ML suspension Take 15 mLs by mouth every 4 (four) hours as needed for indigestion or heartburn. 355 mL 0    No results found for this or any previous visit (from the past 48 hour(s)). No results found.  Review of Systems  Constitutional: Negative.   HENT: Negative.   Eyes: Negative.   Respiratory: Negative.   Cardiovascular: Negative.   Gastrointestinal:       Colostomy in place.  Tethered lower midline abdominal incision.  Genitourinary: Negative.   Musculoskeletal: Negative.   Skin: Negative.   Neurological: Negative.   Endo/Heme/Allergies: Negative.     Blood pressure 137/67, pulse 97, temperature 98.7 F (37.1 C), temperature source Oral, resp. rate  20, weight 86.6 kg (191 lb), SpO2 95 %. Physical Exam  Constitutional: She is oriented to person, place, and time. She appears well-developed and well-nourished.  Obese  HENT:  Head: Normocephalic and atraumatic.  Eyes: Conjunctivae and EOM are normal. Pupils are equal, round, and reactive to light.  Neck: Normal range of motion. Neck supple.  Cardiovascular: Normal rate, regular rhythm and normal heart sounds.   Respiratory: Effort normal and breath sounds normal.  GI: Soft. Bowel sounds are normal. She exhibits no distension. There is no tenderness. There is no rebound.    Musculoskeletal: Normal range of motion.    Neurological: She is alert and oriented to person, place, and time. She has normal reflexes.  Skin: Skin is warm and dry.  Psychiatric: She has a normal mood and affect. Her behavior is normal. Judgment and thought content normal.     Assessment/Plan Colostomy in place.  For reversal  Patient had had a miralax and antibiotic bowel prep. For reversal in lithotomy position today.  Judeth Horn, MD 12/19/2015, 7:13 AM

## 2015-12-19 NOTE — Transfer of Care (Signed)
Immediate Anesthesia Transfer of Care Note  Patient: Shannon [name]  Procedure(s) Performed: Procedure(s): REVERSAL OF COLOSTOMY (N/A)  Patient Location: PACU  Anesthesia Type:General  Level of Consciousness: awake, oriented, sedated, patient cooperative and responds to stimulation  Airway & Oxygen Therapy: Patient Spontanous Breathing and Patient connected to nasal cannula oxygen  Post-op Assessment: Report given to RN, Post -op Vital signs reviewed and stable, Patient moving all extremities and Patient moving all extremities X 4  Post vital signs: Reviewed and stable  Last Vitals:  Vitals:   12/19/15 0555 12/19/15 1012  BP: 137/67 (!) 163/96  Pulse: 97 99  Resp: 20 (!) 23  Temp: 37.1 C 36.8 C    Last Pain:  Vitals:   12/19/15 0555  TempSrc: Oral      Patients Stated Pain Goal: 4 ([id number])  Complications: No apparent anesthesia complications

## 2015-12-20 ENCOUNTER — Encounter (HOSPITAL_COMMUNITY): Payer: Self-pay | Admitting: General Surgery

## 2015-12-20 LAB — CBC
HEMATOCRIT: 38.7 % (ref 36.0–46.0)
Hemoglobin: 12.5 g/dL (ref 12.0–15.0)
MCH: 31.6 pg (ref 26.0–34.0)
MCHC: 32.3 g/dL (ref 30.0–36.0)
MCV: 98 fL (ref 78.0–100.0)
Platelets: 215 10*3/uL (ref 150–400)
RBC: 3.95 MIL/uL (ref 3.87–5.11)
RDW: 14 % (ref 11.5–15.5)
WBC: 11.8 10*3/uL — AB (ref 4.0–10.5)

## 2015-12-20 LAB — BASIC METABOLIC PANEL
Anion gap: 7 (ref 5–15)
CHLORIDE: 105 mmol/L (ref 101–111)
CO2: 25 mmol/L (ref 22–32)
CREATININE: 0.84 mg/dL (ref 0.44–1.00)
Calcium: 8.9 mg/dL (ref 8.9–10.3)
GFR calc Af Amer: >60 mL/min (ref >60)
GFR calc non Af Amer: >60 mL/min (ref >60)
GLUCOSE: 132 mg/dL — AB (ref 65–99)
Potassium: 4.2 mmol/L (ref 3.5–5.1)
SODIUM: 137 mmol/L (ref 135–145)

## 2015-12-20 MED ORDER — FAMOTIDINE 20 MG PO TABS
20.0000 mg | ORAL_TABLET | Freq: Two times a day (BID) | ORAL | Status: DC
  Administered 2015-12-20 – 2015-12-23 (×7): 20 mg via ORAL
  Filled 2015-12-20 (×7): qty 1

## 2015-12-20 NOTE — Care Management Note (Signed)
Case Management Note  Patient Details  Name: Shannon [name] MRN: [id number] Date of Birth: May 02, 1950  Subjective/Objective:                 Patient admitted for reanastomosis of colostomy. From home alone, independent. Has used [facility] in the past for [facility] and would like to use them again if needed. Has RW from previous admission, does not use. Did not identify any needs at home. Has closed abd incision. Pharmacy: [facility] on [address] PCP: Brigitte Pulse   Action/Plan:  No CM needs identified at this time, will continue to follow.   Expected Discharge Date:                  Expected Discharge Plan:  Home/Self Care  In-House Referral:  NA  Discharge planning Services  CM Consult  Post Acute Care Choice:  NA Choice offered to:  NA  DME Arranged:  N/A DME Agency:  NA  HH Arranged:  NA HH Agency:  NA  Status of Service:  In process, will continue to follow  If discussed at Long Length of Stay Meetings, dates discussed:    Additional Comments:  Carles Collet, RN 12/20/2015, 2:11 PM

## 2015-12-20 NOTE — Progress Notes (Signed)
GS Progress Note Subjective: Patient doing okay this AM.  No major complaints.  Did get up to the side of the bed yesterday evening.  Objective: Vital signs in last 24 hours: Temp:  [97.3 F (36.3 C)-100.5 F (38.1 C)] 98.8 F (37.1 C) (09/12 0453) Pulse Rate:  [82-99] 83 (09/12 0453) Resp:  [7-23] 18 (09/12 0453) BP: (94-163)/(56-96) 142/63 (09/12 0453) SpO2:  [95 %-99 %] 98 % (09/12 0453) Last BM Date: 12/18/15  Intake/Output from previous day: 09/11 0701 - 09/12 0700 In: 2815.4 [I.V.:2815.4] Out: 2850 [Urine:2750; Blood:100] Intake/Output this shift: No intake/output data recorded.  Lungs: Clear to auscultation.  IS at bedside.  Abd: Moderately distended, good bowel sounds.  Not tender.  Wound is covered.  Extremities: SCDs in place.  No clinical signs or symptoms of DVT  Neuro: Intact  Lab Results: CBC   Recent Labs  12/20/15 0435  WBC 11.8*  HGB 12.5  HCT 38.7  PLT 215   BMET No results for input(s): NA, K, CL, CO2, GLUCOSE, BUN, CREATININE, CALCIUM in the last 72 hours. PT/INR No results for input(s): LABPROT, INR in the last 72 hours. ABG No results for input(s): PHART, HCO3 in the last 72 hours.  Invalid input(s): PCO2, PO2  Studies/Results: No results found.  Anti-infectives: Anti-infectives    Start     Dose/Rate Route Frequency Ordered Stop   12/19/15 1900  cefoTEtan (CEFOTAN) 2 g in dextrose 5 % 50 mL IVPB     2 g 100 mL/hr over 30 Minutes Intravenous Every 12 hours 12/19/15 1621 12/19/15 2145   12/19/15 0700  cefoTEtan (CEFOTAN) 2 g in dextrose 5 % 50 mL IVPB     2 g 100 mL/hr over 30 Minutes Intravenous To ShortStay Surgical 12/16/15 0919 12/19/15 0755   12/19/15 0559  cefoTEtan in Dextrose 5% (CEFOTAN) 2-2.08 GM-% IVPB    Comments:  Shannon [name]   : cabinet override      12/19/15 0559 12/19/15 1814      Assessment/Plan: s/p Procedure(s): REVERSAL OF COLOSTOMY d/c foley Continue clear l iquids.  Make sure the patient has  Entereg ordered and given.  LOS: 1 day    Kathryne Eriksson. Dahlia Bailiff, MD, FACS [phone] [facility] 12/20/2015

## 2015-12-21 NOTE — Progress Notes (Signed)
GS Progress Note Subjective: Patient is doing well.  No significant bowel movement or flatus.  Minimal discomfort.  Objective: Vital signs in last 24 hours: Temp:  [98.2 F (36.8 C)-99.2 F (37.3 C)] 98.5 F (36.9 C) (09/13 0542) Pulse Rate:  [76-85] 76 (09/13 0619) Resp:  [15-18] 17 (09/13 0542) BP: (97-116)/(38-78) 116/78 (09/13 0619) SpO2:  [96 %-100 %] 99 % (09/13 0542) Last BM Date: 12/18/15  Intake/Output from previous day: 09/12 0701 - 09/13 0700 In: 3210.8 [P.O.:840; I.V.:2370.8] Out: 300 [Urine:300] Intake/Output this shift: No intake/output data recorded.  Lungs: Clear to auscultation.  Sats are good  Abd: Soft, mildly distended.  Good bowel sounds.  Dressing to be changed tomorrow  Extremities: No clinical signs or symptoms of DVT  Neuro: Intact  Lab Results: CBC   Recent Labs  12/20/15 0435  WBC 11.8*  HGB 12.5  HCT 38.7  PLT 215   BMET  Recent Labs  12/20/15 2140  NA 137  K 4.2  CL 105  CO2 25  GLUCOSE 132*  BUN <5*  CREATININE 0.84  CALCIUM 8.9   PT/INR No results for input(s): LABPROT, INR in the last 72 hours. ABG No results for input(s): PHART, HCO3 in the last 72 hours.  Invalid input(s): PCO2, PO2  Studies/Results: No results found.  Anti-infectives: Anti-infectives    Start     Dose/Rate Route Frequency Ordered Stop   12/19/15 1900  cefoTEtan (CEFOTAN) 2 g in dextrose 5 % 50 mL IVPB     2 g 100 mL/hr over 30 Minutes Intravenous Every 12 hours 12/19/15 1621 12/19/15 2145   12/19/15 0700  cefoTEtan (CEFOTAN) 2 g in dextrose 5 % 50 mL IVPB     2 g 100 mL/hr over 30 Minutes Intravenous To ShortStay Surgical 12/16/15 0919 12/19/15 0755   12/19/15 0559  cefoTEtan in Dextrose 5% (CEFOTAN) 2-2.08 GM-% IVPB    Comments:  Ara Kussmaul   : cabinet override      12/19/15 0559 12/19/15 1814      Assessment/Plan: s/p Procedure(s): REVERSAL OF COLOSTOMY Advance diet Full liquids.  LOS: 2 days    Kathryne Eriksson. Dahlia Bailiff, MD,  FACS [phone] [facility] Surgery 12/21/2015

## 2015-12-21 NOTE — Evaluation (Signed)
Physical Therapy Evaluation Patient Details Name: Shannon [name] MRN: [id number] DOB: May 28, 1950 Today's Date: 12/21/2015   History of Present Illness  Patient is a 65 y/o female with hx of colectomy, diverticulosis, breast ca and arthritis presents s/p colostomy reversal.  Clinical Impression  Patient presents with pain and decreased mobility s/p above surgery. Pt lives alone and is independent PTA. Tolerated gait training with supervision for safety. Encouraged OOB to chair for all meals. Needs to negotiate steps next session as tolerated to prepare for d/c home. Will follow acutely to maximize independence and mobility prior to return home.    Follow Up Recommendations No PT follow up;Supervision - Intermittent    Equipment Recommendations  None recommended by PT    Recommendations for Other Services       Precautions / Restrictions Precautions Precautions: None Restrictions Weight Bearing Restrictions: No      Mobility  Bed Mobility Overal bed mobility: Needs Assistance Bed Mobility: Rolling;Sidelying to Sit;Sit to Supine Rolling: Supervision Sidelying to sit: Supervision   Sit to supine: Supervision   General bed mobility comments: HOB 20 degrees; use of rail. Cues for log roll technique.   Transfers Overall transfer level: Needs assistance Equipment used: None Transfers: Sit to/from Stand Sit to Stand: Supervision         General transfer comment: Supervision for safety.   Ambulation/Gait Ambulation/Gait assistance: Supervision Ambulation Distance (Feet): 150 Feet Assistive device: Rolling walker (2 wheeled) Gait Pattern/deviations: Step-through pattern;Decreased stride length Gait velocity: decreased Gait velocity interpretation: Below normal speed for age/gender General Gait Details: Slow, steady gait with use of RW.   Stairs            Wheelchair Mobility    Modified Rankin (Stroke Patients Only)       Balance Overall balance  assessment: Needs assistance Sitting-balance support: Feet supported;No upper extremity supported Sitting balance-Leahy Scale: Good     Standing balance support: During functional activity Standing balance-Leahy Scale: Fair Standing balance comment: Able to stand statically without UE support.                             Pertinent Vitals/Pain Pain Assessment: 0-10 Pain Score: 5  Pain Location: abdomen Pain Descriptors / Indicators: Sore Pain Intervention(s): Monitored during session;Repositioned;Ice applied    Home Living Family/patient expects to be discharged to:: Private residence Living Arrangements: Alone Available Help at Discharge: Family;Available PRN/intermittently Type of Home: House Home Access: Stairs to enter Entrance Stairs-Rails: Chemical engineer of Steps: 3-4 Home Layout: One level Home Equipment: Walker - 2 wheels;Shower seat      Prior Function Level of Independence: Independent         Comments: Pt reports her son will be moving in with her in a month.     Hand Dominance        Extremity/Trunk Assessment   Upper Extremity Assessment: Defer to OT evaluation           Lower Extremity Assessment: Overall WFL for tasks assessed         Communication   Communication: No difficulties  Cognition Arousal/Alertness: Awake/alert Behavior During Therapy: WFL for tasks assessed/performed Overall Cognitive Status: Within Functional Limits for tasks assessed                      General Comments      Exercises        Assessment/Plan    PT Assessment Patient needs  continued PT services  PT Diagnosis Difficulty walking;Acute pain   PT Problem List Decreased mobility;Decreased balance;Decreased activity tolerance;Pain  PT Treatment Interventions Therapeutic activities;Gait training;Therapeutic exercise;Stair training;Functional mobility training;Patient/family education   PT Goals (Current goals can  be found in the Care Plan section) Acute Rehab PT Goals Patient Stated Goal: to go home when ready PT Goal Formulation: With patient Time For Goal Achievement: 01/04/16 Potential to Achieve Goals: Good    Frequency Min 3X/week   Barriers to discharge Decreased caregiver support lives alone    Co-evaluation               End of Session Equipment Utilized During Treatment: Gait belt Activity Tolerance: Patient tolerated treatment well Patient left: in bed;with call bell/phone within reach Nurse Communication: Mobility status         Time: [id number] PT Time Calculation (min) (ACUTE ONLY): 23 min   Charges:   PT Evaluation $PT Eval Moderate Complexity: 1 Procedure PT Treatments $Gait Training: 8-22 mins   PT G Codes:        Zaven Klemens A Kumiko Fishman 12/21/2015, 4:28 PM  Wray Kearns, [place], DPT [phone]

## 2015-12-22 MED ORDER — HYDROMORPHONE HCL 1 MG/ML IJ SOLN
0.5000 mg | INTRAMUSCULAR | Status: DC | PRN
Start: 2015-12-22 — End: 2015-12-23

## 2015-12-22 MED ORDER — TRAMADOL HCL 50 MG PO TABS
100.0000 mg | ORAL_TABLET | Freq: Four times a day (QID) | ORAL | Status: DC | PRN
  Administered 2015-12-22 – 2015-12-23 (×4): 100 mg via ORAL
  Filled 2015-12-22 (×4): qty 2

## 2015-12-22 MED ORDER — SODIUM CHLORIDE 0.9% FLUSH: 3.0000 mL | INTRAVENOUS | Status: DC | PRN

## 2015-12-22 MED ORDER — ACETAMINOPHEN 500 MG PO TABS: 1000.0000 mg | ORAL_TABLET | Freq: Four times a day (QID) | ORAL | Status: DC | PRN

## 2015-12-22 NOTE — Progress Notes (Signed)
Wasted 8mg  Dilaudid PCA in sink. Witnessed by Homero Fellers.

## 2015-12-22 NOTE — Progress Notes (Signed)
Physical Therapy Treatment Patient Details Name: Shannon [name] MRN: [id number] DOB: 1950/11/04 Today's Date: 12/22/2015    History of Present Illness Patient is a 65 y/o female with hx of colectomy, diverticulosis, breast ca and arthritis presents s/p colostomy reversal.    PT Comments    Pt with good recall of proper body mechanics for bed mobility.  She is making good progress with gait and stairs.  Follow Up Recommendations  No PT follow up;Supervision - Intermittent     Equipment Recommendations  None recommended by PT    Recommendations for Other Services       Precautions / Restrictions Precautions Precautions: None Restrictions Weight Bearing Restrictions: No    Mobility  Bed Mobility Overal bed mobility: Needs Assistance Bed Mobility: Rolling;Sidelying to Sit Rolling: Modified independent (Device/Increase time) Sidelying to sit: Modified independent (Device/Increase time) (with use of rail)       General bed mobility comments: HOB flat with use of rail.  Good log rolling technique  Transfers Overall transfer level: Needs assistance Equipment used: None Transfers: Sit to/from Stand Sit to Stand: Supervision         General transfer comment: cues to not pull ont RW  Ambulation/Gait Ambulation/Gait assistance: Supervision Ambulation Distance (Feet): 200 Feet Assistive device: Rolling walker (2 wheeled) Gait Pattern/deviations: Step-through pattern     General Gait Details: steady gait pattern with use of RW, but not overly reliant on its use.   Stairs Stairs: Yes Stairs assistance: Supervision Stair Management: Step to pattern;Backwards;Two rails;Forwards Number of Stairs: 2 General stair comments: AMb up 2 steps with step to pattern and then went down backwards due to IV pole  Wheelchair Mobility    Modified Rankin (Stroke Patients Only)       Balance Overall balance assessment: No apparent balance deficits (not formally  assessed)                                  Cognition Arousal/Alertness: Awake/alert Behavior During Therapy: WFL for tasks assessed/performed Overall Cognitive Status: Within Functional Limits for tasks assessed                      Exercises      General Comments        Pertinent Vitals/Pain Pain Assessment: 0-10 Pain Score: 5  Pain Location: abd Pain Intervention(s): Limited activity within patient's tolerance;Monitored during session    Home Living                      Prior Function            PT Goals (current goals can now be found in the care plan section) Acute Rehab PT Goals Patient Stated Goal: to go home when ready PT Goal Formulation: With patient Time For Goal Achievement: 01/04/16 Potential to Achieve Goals: Good Progress towards PT goals: Progressing toward goals    Frequency  Min 3X/week    PT Plan Current plan remains appropriate    Co-evaluation             End of Session Equipment Utilized During Treatment: Gait belt Activity Tolerance: Patient tolerated treatment well Patient left: Other (comment);with call bell/phone within reach (on [facility])     Time: [id number] PT Time Calculation (min) (ACUTE ONLY): 18 min  Charges:  $Gait Training: 8-22 mins  G Codes:      Shannon [name] LUBECK 12/22/2015, 1:53 PM

## 2015-12-22 NOTE — Progress Notes (Signed)
GS Progress Note Subjective: The patient has had several bowel movements since yesterday morning.    Objective: Vital signs in last 24 hours: Temp:  [97.9 F (36.6 C)-99.5 F (37.5 C)] 97.9 F (36.6 C) (09/14 0516) Pulse Rate:  [76-95] 76 (09/14 0516) Resp:  [11-19] 18 (09/14 0516) BP: (109-134)/(49-65) 116/56 (09/14 0516) SpO2:  [98 %-100 %] 98 % (09/14 0516) Last BM Date: 12/18/15  Intake/Output from previous day: 09/13 0701 - 09/14 0700 In: 2896 [P.O.:240; I.V.:2656] Out: 1175 [Urine:1175] Intake/Output this shift: No intake/output data recorded.  Lungs: No acute distress.  Abd: Soft, minimally distended.  Good bowel sounds.  Dressing removed.  Slight opening at ostomy site with some drainage.  Extremities: No clinical signs or symptoms of DVT  Neuro: Intact  Lab Results: CBC   Recent Labs  12/20/15 0435  WBC 11.8*  HGB 12.5  HCT 38.7  PLT 215   BMET  Recent Labs  12/20/15 2140  NA 137  K 4.2  CL 105  CO2 25  GLUCOSE 132*  BUN <5*  CREATININE 0.84  CALCIUM 8.9   PT/INR No results for input(s): LABPROT, INR in the last 72 hours. ABG No results for input(s): PHART, HCO3 in the last 72 hours.  Invalid input(s): PCO2, PO2  Studies/Results: No results found.  Anti-infectives: Anti-infectives    Start     Dose/Rate Route Frequency Ordered Stop   12/19/15 1900  cefoTEtan (CEFOTAN) 2 g in dextrose 5 % 50 mL IVPB     2 g 100 mL/hr over 30 Minutes Intravenous Every 12 hours 12/19/15 1621 12/19/15 2145   12/19/15 0700  cefoTEtan (CEFOTAN) 2 g in dextrose 5 % 50 mL IVPB     2 g 100 mL/hr over 30 Minutes Intravenous To ShortStay Surgical 12/16/15 0919 12/19/15 0755   12/19/15 0559  cefoTEtan in Dextrose 5% (CEFOTAN) 2-2.08 GM-% IVPB    Comments:  Ara Kussmaul   : cabinet override      12/19/15 0559 12/19/15 1814      Assessment/Plan: s/p Procedure(s): REVERSAL OF COLOSTOMY Advance diet Plan for discharge tomorrow  LOS: 3 days    Kathryne Eriksson. Dahlia Bailiff, MD, FACS [phone] [facility] Surgery 12/22/2015

## 2015-12-23 MED ORDER — TRAMADOL HCL 50 MG PO TABS: 100.0000 mg | ORAL_TABLET | Freq: Four times a day (QID) | ORAL | 0 refills | Status: AC | PRN

## 2015-12-23 NOTE — Discharge Summary (Signed)
Physician Discharge Summary  Patient ID: Shannon [name] MRN: [id number] DOB/AGE: Oct 02, 1950 65 y.o.  Admit date: 12/19/2015 Discharge date: 12/23/2015  Admission Diagnoses:  Discharge Diagnoses:  Active Problems:   Colostomy in place [facility])   Discharged Condition: good  Hospital Course: Admitted after left sided colostomy reversal.  Did well with alvimopan, starting having bowel movements on POD #2, has continued until discharge.  Highest fever postop was 100.5, che is currently afebrile.  Eating and tolerating a soft diet.    Consults: None  Significant Diagnostic Studies: labs: cbc/bmet  Treatments: IV hydration, antibiotics: cefotetan, analgesia: Dilaudid and Tramadol and surgery: Reversal of colostomy  Discharge Exam: Blood pressure (!) 115/41, pulse 84, temperature 97.9 F (36.6 C), temperature source Oral, resp. rate 17, weight 86.6 kg (191 lb), SpO2 97 %. General appearance: alert, cooperative and no distress GI: soft, non-tender; bowel sounds normal; no masses,  no organomegaly and Midline wound with some mild ecchymosis of the upper part, minimal drainage.  Slightly intended openingof the colostomy site incisiono  Disposition: 06-Home-Health Care Svc  Discharge Instructions    Call MD for:  difficulty breathing, headache or visual disturbances    Complete by:  As directed    Call MD for:  extreme fatigue    Complete by:  As directed    Call MD for:  hives    Complete by:  As directed    Call MD for:  persistant dizziness or light-headedness    Complete by:  As directed    Call MD for:  persistant nausea and vomiting    Complete by:  As directed    Call MD for:  redness, tenderness, or signs of infection (pain, swelling, redness, odor or green/yellow discharge around incision site)    Complete by:  As directed    Call MD for:  severe uncontrolled pain    Complete by:  As directed    Call MD for:  temperature >100.4    Complete by:  As directed    Change  dressing (specify)    Complete by:  As directed    Dressing change: colostomy site as needed daily using sterile 4x4 gauze.   Diet - low sodium heart healthy    Complete by:  As directed    Discharge instructions    Complete by:  As directed    Follow up as instructed.  May shower and pat wound dry.  See specific instructions for colostomy reversal   Driving Restrictions    Complete by:  As directed    2 weeks   Increase activity slowly    Complete by:  As directed    Lifting restrictions    Complete by:  As directed    > 20 pounds 6 weeks.       Medication List    TAKE these medications   alum & mag hydroxide-simeth 200-200-20 MG/5ML suspension Commonly known as:  MAALOX/MYLANTA Take 15 mLs by mouth every 4 (four) hours as needed for indigestion or heartburn.   aspirin-acetaminophen-caffeine 250-250-65 MG tablet Commonly known as:  EXCEDRIN MIGRAINE Take 1 tablet by mouth every 6 (six) hours as needed for headache.   CALCIUM PO Take 1,200 mg by mouth daily.   celecoxib 400 MG capsule Commonly known as:  CELEBREX Take 400 mg by mouth daily after breakfast.   cetirizine 10 MG tablet Commonly known as:  ZYRTEC Take 10 mg by mouth daily as needed for allergies.   FISH OIL PO Take 1,200 mg by mouth  daily.   multivitamin with minerals Tabs tablet Take 1 tablet by mouth daily.   omeprazole 40 MG capsule Commonly known as:  PRILOSEC Take 40 mg by mouth at bedtime.   SYSTANE OP Apply 1-2 drops to eye daily as needed (dry eyes).   traMADol 50 MG tablet Commonly known as:  ULTRAM Take 2 tablets (100 mg total) by mouth every 6 (six) hours as needed for moderate pain or severe pain.   traMADol-acetaminophen 37.5-325 MG tablet Commonly known as:  ULTRACET Take 1-2 tablets by mouth every 6 (six) hours as needed for moderate pain.      [facility]-up [facility] Follow up on 12/29/2015.   Specialty:  General Surgery Why:  10:00 AM with  Sharyn Lull for wound check and staple removal. Contact information: [address] [place] [phone]          Signed: Judeth Horn 12/23/2015, 11:00 AM

## 2015-12-23 NOTE — Discharge Instructions (Signed)
End Colostomy Reversal, Care After Refer to this sheet in the next few weeks. These instructions provide you with information on caring for yourself after your procedure. Your health care provider may also give you more specific instructions. Your treatment has been planned according to current medical practices, but problems sometimes occur. Call your health care provider if you have any problems or questions after your procedure. HOME CARE INSTRUCTIONS  Change your bandages (dressings) as directed by your health care provider.  Keep the wound clean and dry. The wound may be washed gently with soap and water. Do not rub the wound. Gently pat the wound dry with a clean towel.  Do not take baths, swim, or use hot tubs for 10 days, or as directed by your health care provider.  Only take over-the-counter or prescription medicines for pain, discomfort, or fever as directed by your health care provider.   May shower and pat the wound dry  The colostomy wound site will drain more and needs to be covered after showers  You may resume a normal diet as directed by your health care provider.  Do not lift more than 10 pounds (4.5 kg) or play contact sports for 4 weeks, or as directed by your health care provider.  Use a stool softener if recommended by your health care provider, especially with pain medicine. SEEK MEDICAL CARE IF:  You have redness, swelling, or increasing pain in the wound.  You notice pus coming from the wound.  You have drainage from the wound lasting longer than 1 day.  You notice a bad smell coming from the wound or dressing.  Your wound breaks open.  You have persistent nausea or vomiting.  You cannot have a bowel movement.  You have pain that is not controlled with medicine. SEEK IMMEDIATE MEDICAL CARE IF:  You have a fever.  You have a rash.  You have difficulty breathing.  You have any reaction or side effects to your medicines.   This information is not  intended to replace advice given to you by your health care provider. Make sure you discuss any questions you have with your health care provider.   Document Released: 06/18/2011 Document Revised: 08/10/2014 Document Reviewed: 06/18/2011 Elsevier Interactive Patient Education [employer].

## 2015-12-23 NOTE — Progress Notes (Signed)
Pt discharged to home, VSS, no c/o pain. Patient understands d/c instructions via teachback, all questions answered. Belongings with pt. IV d/c'ed prior to discharge.

## 2016-01-01 ENCOUNTER — Telehealth: Payer: Self-pay | Admitting: Surgery

## 2016-01-01 NOTE — Telephone Encounter (Signed)
Shannon [name]  12-03-1950 [id number]  Patient Care Team: Mayra Neer, MD as PCP - General (Family Medicine)  This patient is a 65 y.o.female who calls today for surgical evaluation.   Date of procedure/visit: 12/19/2015  Surgery: DATE OF OPERATION: 12/19/2015  PATIENT:  Shannon [name]  65 y.o. 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  15 cm distal rectosigmoid segment.  PROCEDURE:  Procedure(s): REVERSAL OF COLOSTOMY RIGID SIGMOIDOSCOPY  SURGEON:  Surgeon(s): Judeth Horn, MD Ralene Ok, MD  Reason for call: Pain  Patient called with concern of temp 100.4.  She has been having R sided abdominal pain at night, esp lying on that side.  Tramadol helps a little.  Pain goes away daytime.  Eating fine.  +BMs/flatus.  No n/v/c/d.  Worried.  Incision closed & not swollen draining.  She sounds worried but not sickly.  Got incision staples out 4 d ago in office - not as bad then.  Sounds like MS pain.  100.59F not significant temp, so tried to reassure her.  I recommended tylenol 1g TID, ice/heat round the clock with the tramadol.  Miralax bowel regimen to keeps BM regular.  Keep appt to see Dr Hulen Skains in 2 days.  Prob refill tramadol then.  If not better/worse, may need to go to ED to get labs/CT scan to r/o infection/abscess/leak.  I updated the patient's status to the patient.  Recommendations were made.  Questions were answered.  She expressed understanding & appreciation.   Patient Active Problem List   Diagnosis Date Noted  . Colostomy in place [facility]) 12/19/2015  . Diverticulitis of sigmoid colon 07/06/2015  . Leukocytosis 03/30/2015  . Hypokalemia 03/30/2015  . Diverticulitis of intestine with perforation without bleeding 03/29/2015  . GERD 03/25/2008    Past Medical History:   Diagnosis Date  . Allergy   . Arthritis   . Breast cancer ([place])    1991right breast  . Colon polyps   . Diverticulitis   . Gallstones   . GERD (gastroesophageal reflux disease)   . Headache    occasionally  . Obesity     Past Surgical History:  Procedure Laterality Date  . ABDOMINAL HYSTERECTOMY    . CHOLECYSTECTOMY    . COLECTOMY WITH COLOSTOMY CREATION/HARTMANN PROCEDURE N/A 07/07/2015   Procedure: COLECTOMY WITH COLOSTOMY CREATION/HARTMANN PROCEDURE;  Surgeon: Judeth Horn, MD;  Location: [place];  Service: General;  Laterality: N/A;  . COLOSTOMY Left 07/07/2015   Procedure: COLOSTOMY;  Surgeon: Judeth Horn, MD;  Location: [place];  Service: General;  Laterality: Left;  . COLOSTOMY REVERSAL N/A 12/19/2015   Procedure: REVERSAL OF COLOSTOMY;  Surgeon: Judeth Horn, MD;  Location: [place];  Service: General;  Laterality: N/A;  . MASTECTOMY Right    with bilateral implants  . TUBAL LIGATION      Social History   Social History  . Marital status: Divorced    Spouse name: N/A  . Number of children: N/A  . Years of education: N/A   Occupational History  . retired    Social History Main Topics  . Smoking status: Former Smoker    Quit date: 04/09/2005  . Smokeless tobacco: Never Used  . Alcohol use No  . Drug use: No  . Sexual activity: Not on file   Other Topics Concern  . Not on file   Social  History Narrative  . No narrative on file    Family History  Problem Relation Age of Onset  . Dementia Mother   . Skin cancer Father   . Pancreatic cancer Father   . Gastric cancer Maternal Grandmother   . Diverticulitis Paternal Grandmother     Current Outpatient Prescriptions  Medication Sig Dispense Refill  . alum & mag hydroxide-simeth (MAALOX/MYLANTA) 200-200-20 MG/5ML suspension Take 15 mLs by mouth every 4 (four) hours as needed for indigestion or heartburn. 355 mL 0  . aspirin-acetaminophen-caffeine (EXCEDRIN MIGRAINE) 250-250-65 MG tablet Take 1 tablet by mouth every  6 (six) hours as needed for headache.    Marland Kitchen CALCIUM PO Take 1,200 mg by mouth daily.     . celecoxib (CELEBREX) 400 MG capsule Take 400 mg by mouth daily after breakfast.    . cetirizine (ZYRTEC) 10 MG tablet Take 10 mg by mouth daily as needed for allergies.    . Multiple Vitamin (MULTIVITAMIN WITH MINERALS) TABS tablet Take 1 tablet by mouth daily.    . Omega-3 Fatty Acids (FISH OIL PO) Take 1,200 mg by mouth daily.     Marland Kitchen omeprazole (PRILOSEC) 40 MG capsule Take 40 mg by mouth at bedtime.    Vladimir Faster Glycol-Propyl Glycol (SYSTANE OP) Apply 1-2 drops to eye daily as needed (dry eyes).    . traMADol (ULTRAM) 50 MG tablet Take 2 tablets (100 mg total) by mouth every 6 (six) hours as needed for moderate pain or severe pain. 30 tablet 0  . traMADol-acetaminophen (ULTRACET) 37.5-325 MG tablet Take 1-2 tablets by mouth every 6 (six) hours as needed for moderate pain.     No current facility-administered medications for this visit.      Allergies  Allergen Reactions  . Codeine Nausea Only  . Morphine And Related Anxiety    Taking over body, felt like I was gonna die  . Oxycodone Nausea Only    @VS @  No results found.  Note: This dictation was prepared with Dragon/digital dictation along with [employer]. Any transcriptional errors that result from this process are unintentional.

## 2016-06-20 DIAGNOSIS — E7800 Pure hypercholesterolemia, unspecified: Secondary | ICD-10-CM | POA: Diagnosis not present

## 2016-06-20 DIAGNOSIS — R7301 Impaired fasting glucose: Secondary | ICD-10-CM | POA: Diagnosis not present

## 2016-08-09 DIAGNOSIS — H34832 Tributary (branch) retinal vein occlusion, left eye, with macular edema: Secondary | ICD-10-CM | POA: Diagnosis not present

## 2016-08-09 DIAGNOSIS — H2513 Age-related nuclear cataract, bilateral: Secondary | ICD-10-CM | POA: Diagnosis not present

## 2016-08-31 DIAGNOSIS — H3562 Retinal hemorrhage, left eye: Secondary | ICD-10-CM | POA: Diagnosis not present

## 2016-08-31 DIAGNOSIS — H34832 Tributary (branch) retinal vein occlusion, left eye, with macular edema: Secondary | ICD-10-CM | POA: Diagnosis not present

## 2016-08-31 DIAGNOSIS — H2513 Age-related nuclear cataract, bilateral: Secondary | ICD-10-CM | POA: Diagnosis not present

## 2016-09-19 DIAGNOSIS — H34832 Tributary (branch) retinal vein occlusion, left eye, with macular edema: Secondary | ICD-10-CM | POA: Diagnosis not present

## 2016-09-25 DIAGNOSIS — R69 Illness, unspecified: Secondary | ICD-10-CM | POA: Diagnosis not present

## 2016-09-26 DIAGNOSIS — M25539 Pain in unspecified wrist: Secondary | ICD-10-CM | POA: Diagnosis not present

## 2016-10-19 DIAGNOSIS — H3562 Retinal hemorrhage, left eye: Secondary | ICD-10-CM | POA: Diagnosis not present

## 2016-10-19 DIAGNOSIS — H34832 Tributary (branch) retinal vein occlusion, left eye, with macular edema: Secondary | ICD-10-CM | POA: Diagnosis not present

## 2016-10-30 DIAGNOSIS — R0981 Nasal congestion: Secondary | ICD-10-CM | POA: Diagnosis not present

## 2016-11-28 DIAGNOSIS — H2513 Age-related nuclear cataract, bilateral: Secondary | ICD-10-CM | POA: Diagnosis not present

## 2016-11-28 DIAGNOSIS — H3562 Retinal hemorrhage, left eye: Secondary | ICD-10-CM | POA: Diagnosis not present

## 2016-11-28 DIAGNOSIS — H34832 Tributary (branch) retinal vein occlusion, left eye, with macular edema: Secondary | ICD-10-CM | POA: Diagnosis not present

## 2016-12-04 DIAGNOSIS — Z23 Encounter for immunization: Secondary | ICD-10-CM | POA: Diagnosis not present

## 2016-12-04 DIAGNOSIS — E7800 Pure hypercholesterolemia, unspecified: Secondary | ICD-10-CM | POA: Diagnosis not present

## 2016-12-04 DIAGNOSIS — Z1159 Encounter for screening for other viral diseases: Secondary | ICD-10-CM | POA: Diagnosis not present

## 2016-12-04 DIAGNOSIS — Z0000 Encounter for general adult medical examination without abnormal findings: Secondary | ICD-10-CM | POA: Diagnosis not present

## 2016-12-04 DIAGNOSIS — M1990 Unspecified osteoarthritis, unspecified site: Secondary | ICD-10-CM | POA: Diagnosis not present

## 2016-12-04 DIAGNOSIS — M25531 Pain in right wrist: Secondary | ICD-10-CM | POA: Diagnosis not present

## 2016-12-04 DIAGNOSIS — R7301 Impaired fasting glucose: Secondary | ICD-10-CM | POA: Diagnosis not present

## 2016-12-06 DIAGNOSIS — M1811 Unilateral primary osteoarthritis of first carpometacarpal joint, right hand: Secondary | ICD-10-CM | POA: Diagnosis not present

## 2017-01-09 DIAGNOSIS — H3562 Retinal hemorrhage, left eye: Secondary | ICD-10-CM | POA: Diagnosis not present

## 2017-01-09 DIAGNOSIS — H34832 Tributary (branch) retinal vein occlusion, left eye, with macular edema: Secondary | ICD-10-CM | POA: Diagnosis not present

## 2017-01-09 DIAGNOSIS — H43822 Vitreomacular adhesion, left eye: Secondary | ICD-10-CM | POA: Diagnosis not present

## 2017-01-09 DIAGNOSIS — H2513 Age-related nuclear cataract, bilateral: Secondary | ICD-10-CM | POA: Diagnosis not present

## 2017-01-16 DIAGNOSIS — Z780 Asymptomatic menopausal state: Secondary | ICD-10-CM | POA: Diagnosis not present

## 2017-01-28 DIAGNOSIS — Z1231 Encounter for screening mammogram for malignant neoplasm of breast: Secondary | ICD-10-CM | POA: Diagnosis not present

## 2017-01-28 DIAGNOSIS — Z853 Personal history of malignant neoplasm of breast: Secondary | ICD-10-CM | POA: Diagnosis not present

## 2017-02-20 DIAGNOSIS — H34832 Tributary (branch) retinal vein occlusion, left eye, with macular edema: Secondary | ICD-10-CM | POA: Diagnosis not present

## 2017-02-20 DIAGNOSIS — H43822 Vitreomacular adhesion, left eye: Secondary | ICD-10-CM | POA: Diagnosis not present

## 2017-02-20 DIAGNOSIS — H2513 Age-related nuclear cataract, bilateral: Secondary | ICD-10-CM | POA: Diagnosis not present

## 2017-02-27 DIAGNOSIS — M1811 Unilateral primary osteoarthritis of first carpometacarpal joint, right hand: Secondary | ICD-10-CM | POA: Diagnosis not present

## 2017-03-11 ENCOUNTER — Other Ambulatory Visit: Payer: Self-pay

## 2017-03-11 NOTE — Patient Outreach (Signed)
[place] [facility]) Care Management  03/11/2017  Shannon [name] 05-04-1950 [id number]   [employer] High risk screen- No answer. HIPPA compliant message left.  Thea Silversmith, RN, MSN, [place] Coordinator Cell: [phone]

## 2017-03-12 ENCOUNTER — Other Ambulatory Visit: Payer: Self-pay

## 2017-03-12 NOTE — Patient Outreach (Signed)
[facility] [facility]) Care Management  03/12/2017  Shannon [name] Jun 09, 1950 [id number]   [employer] High risk screen- No answer. HIPPA compliant message left.  Thea Silversmith, RN, MSN, [place] Coordinator Cell: [phone]

## 2017-03-15 DIAGNOSIS — D485 Neoplasm of uncertain behavior of skin: Secondary | ICD-10-CM | POA: Diagnosis not present

## 2017-03-15 DIAGNOSIS — L814 Other melanin hyperpigmentation: Secondary | ICD-10-CM | POA: Diagnosis not present

## 2017-03-15 DIAGNOSIS — L821 Other seborrheic keratosis: Secondary | ICD-10-CM | POA: Diagnosis not present

## 2017-03-15 DIAGNOSIS — D229 Melanocytic nevi, unspecified: Secondary | ICD-10-CM | POA: Diagnosis not present

## 2017-03-15 DIAGNOSIS — Z85828 Personal history of other malignant neoplasm of skin: Secondary | ICD-10-CM | POA: Diagnosis not present

## 2017-03-15 DIAGNOSIS — L570 Actinic keratosis: Secondary | ICD-10-CM | POA: Diagnosis not present

## 2017-03-15 DIAGNOSIS — C44519 Basal cell carcinoma of skin of other part of trunk: Secondary | ICD-10-CM | POA: Diagnosis not present

## 2017-03-18 ENCOUNTER — Other Ambulatory Visit: Payer: Self-pay

## 2017-03-18 NOTE — Patient Outreach (Signed)
[place] [facility]) Care Management  03/18/2017  Shannon [name] 1950/08/14 [id number]   [employer] High risk screen completed. Client reports she just saw her primary care for her annual visit. Denies any questions or concern. No Care Management needs identified.  Thea Silversmith, RN, MSN, [place] Coordinator Cell: [phone]

## 2017-03-18 NOTE — Patient Outreach (Signed)
[place] [facility]) Care Management  03/18/2017  Leslyn Monda 1950/12/09 [id number]   [employer] High risk screen- No answer. HIPPA compliant message left. 3rd attempt.  RNCM will send unable to reach letter.  Thea Silversmith, RN, MSN, [place] Coordinator Cell: [phone]

## 2017-04-12 DIAGNOSIS — H34832 Tributary (branch) retinal vein occlusion, left eye, with macular edema: Secondary | ICD-10-CM | POA: Diagnosis not present

## 2017-04-12 DIAGNOSIS — H43822 Vitreomacular adhesion, left eye: Secondary | ICD-10-CM | POA: Diagnosis not present

## 2017-04-12 DIAGNOSIS — H35031 Hypertensive retinopathy, right eye: Secondary | ICD-10-CM | POA: Diagnosis not present

## 2017-04-12 DIAGNOSIS — H3563 Retinal hemorrhage, bilateral: Secondary | ICD-10-CM | POA: Diagnosis not present

## 2017-04-30 DIAGNOSIS — H34833 Tributary (branch) retinal vein occlusion, bilateral, with macular edema: Secondary | ICD-10-CM | POA: Diagnosis not present

## 2017-04-30 DIAGNOSIS — H43811 Vitreous degeneration, right eye: Secondary | ICD-10-CM | POA: Diagnosis not present

## 2017-04-30 DIAGNOSIS — H3563 Retinal hemorrhage, bilateral: Secondary | ICD-10-CM | POA: Diagnosis not present

## 2017-04-30 DIAGNOSIS — H35031 Hypertensive retinopathy, right eye: Secondary | ICD-10-CM | POA: Diagnosis not present

## 2017-05-28 DIAGNOSIS — C44519 Basal cell carcinoma of skin of other part of trunk: Secondary | ICD-10-CM | POA: Diagnosis not present

## 2017-05-31 DIAGNOSIS — H3563 Retinal hemorrhage, bilateral: Secondary | ICD-10-CM | POA: Diagnosis not present

## 2017-05-31 DIAGNOSIS — H35031 Hypertensive retinopathy, right eye: Secondary | ICD-10-CM | POA: Diagnosis not present

## 2017-05-31 DIAGNOSIS — H34833 Tributary (branch) retinal vein occlusion, bilateral, with macular edema: Secondary | ICD-10-CM | POA: Diagnosis not present

## 2017-05-31 DIAGNOSIS — H43822 Vitreomacular adhesion, left eye: Secondary | ICD-10-CM | POA: Diagnosis not present

## 2017-06-07 DIAGNOSIS — Z6837 Body mass index (BMI) 37.0-37.9, adult: Secondary | ICD-10-CM | POA: Diagnosis not present

## 2017-06-07 DIAGNOSIS — R109 Unspecified abdominal pain: Secondary | ICD-10-CM | POA: Diagnosis not present

## 2017-06-07 DIAGNOSIS — E7800 Pure hypercholesterolemia, unspecified: Secondary | ICD-10-CM | POA: Diagnosis not present

## 2017-06-07 DIAGNOSIS — R7301 Impaired fasting glucose: Secondary | ICD-10-CM | POA: Diagnosis not present

## 2017-06-07 DIAGNOSIS — H349 Unspecified retinal vascular occlusion: Secondary | ICD-10-CM | POA: Diagnosis not present

## 2017-07-03 DIAGNOSIS — H43822 Vitreomacular adhesion, left eye: Secondary | ICD-10-CM | POA: Diagnosis not present

## 2017-07-03 DIAGNOSIS — H34833 Tributary (branch) retinal vein occlusion, bilateral, with macular edema: Secondary | ICD-10-CM | POA: Diagnosis not present

## 2017-07-03 DIAGNOSIS — H43813 Vitreous degeneration, bilateral: Secondary | ICD-10-CM | POA: Diagnosis not present

## 2017-07-03 DIAGNOSIS — H35031 Hypertensive retinopathy, right eye: Secondary | ICD-10-CM | POA: Diagnosis not present

## 2017-07-17 DIAGNOSIS — H34832 Tributary (branch) retinal vein occlusion, left eye, with macular edema: Secondary | ICD-10-CM | POA: Diagnosis not present

## 2017-07-17 DIAGNOSIS — H34833 Tributary (branch) retinal vein occlusion, bilateral, with macular edema: Secondary | ICD-10-CM | POA: Diagnosis not present

## 2017-07-26 DIAGNOSIS — L570 Actinic keratosis: Secondary | ICD-10-CM | POA: Diagnosis not present

## 2017-07-26 DIAGNOSIS — L905 Scar conditions and fibrosis of skin: Secondary | ICD-10-CM | POA: Diagnosis not present

## 2017-07-26 DIAGNOSIS — Z85828 Personal history of other malignant neoplasm of skin: Secondary | ICD-10-CM | POA: Diagnosis not present

## 2017-08-01 DIAGNOSIS — R300 Dysuria: Secondary | ICD-10-CM | POA: Diagnosis not present

## 2017-08-01 DIAGNOSIS — J309 Allergic rhinitis, unspecified: Secondary | ICD-10-CM | POA: Diagnosis not present

## 2017-08-13 DIAGNOSIS — H2513 Age-related nuclear cataract, bilateral: Secondary | ICD-10-CM | POA: Diagnosis not present

## 2017-08-13 DIAGNOSIS — H34833 Tributary (branch) retinal vein occlusion, bilateral, with macular edema: Secondary | ICD-10-CM | POA: Diagnosis not present

## 2017-08-14 DIAGNOSIS — H3563 Retinal hemorrhage, bilateral: Secondary | ICD-10-CM | POA: Diagnosis not present

## 2017-08-14 DIAGNOSIS — H34833 Tributary (branch) retinal vein occlusion, bilateral, with macular edema: Secondary | ICD-10-CM | POA: Diagnosis not present

## 2017-08-14 DIAGNOSIS — H43813 Vitreous degeneration, bilateral: Secondary | ICD-10-CM | POA: Diagnosis not present

## 2017-08-14 DIAGNOSIS — H35031 Hypertensive retinopathy, right eye: Secondary | ICD-10-CM | POA: Diagnosis not present

## 2017-09-24 DIAGNOSIS — H43813 Vitreous degeneration, bilateral: Secondary | ICD-10-CM | POA: Diagnosis not present

## 2017-09-24 DIAGNOSIS — H35031 Hypertensive retinopathy, right eye: Secondary | ICD-10-CM | POA: Diagnosis not present

## 2017-09-24 DIAGNOSIS — H34833 Tributary (branch) retinal vein occlusion, bilateral, with macular edema: Secondary | ICD-10-CM | POA: Diagnosis not present

## 2017-11-19 DIAGNOSIS — H43813 Vitreous degeneration, bilateral: Secondary | ICD-10-CM | POA: Diagnosis not present

## 2017-11-19 DIAGNOSIS — H35031 Hypertensive retinopathy, right eye: Secondary | ICD-10-CM | POA: Diagnosis not present

## 2017-11-19 DIAGNOSIS — H2513 Age-related nuclear cataract, bilateral: Secondary | ICD-10-CM | POA: Diagnosis not present

## 2017-11-19 DIAGNOSIS — H34833 Tributary (branch) retinal vein occlusion, bilateral, with macular edema: Secondary | ICD-10-CM | POA: Diagnosis not present

## 2017-12-05 DIAGNOSIS — E7800 Pure hypercholesterolemia, unspecified: Secondary | ICD-10-CM | POA: Diagnosis not present

## 2017-12-05 DIAGNOSIS — Z6836 Body mass index (BMI) 36.0-36.9, adult: Secondary | ICD-10-CM | POA: Diagnosis not present

## 2017-12-05 DIAGNOSIS — M1990 Unspecified osteoarthritis, unspecified site: Secondary | ICD-10-CM | POA: Diagnosis not present

## 2017-12-05 DIAGNOSIS — J309 Allergic rhinitis, unspecified: Secondary | ICD-10-CM | POA: Diagnosis not present

## 2017-12-05 DIAGNOSIS — Z0000 Encounter for general adult medical examination without abnormal findings: Secondary | ICD-10-CM | POA: Diagnosis not present

## 2017-12-05 DIAGNOSIS — R109 Unspecified abdominal pain: Secondary | ICD-10-CM | POA: Diagnosis not present

## 2017-12-05 DIAGNOSIS — R7301 Impaired fasting glucose: Secondary | ICD-10-CM | POA: Diagnosis not present

## 2017-12-05 DIAGNOSIS — Z23 Encounter for immunization: Secondary | ICD-10-CM | POA: Diagnosis not present

## 2017-12-05 DIAGNOSIS — M542 Cervicalgia: Secondary | ICD-10-CM | POA: Diagnosis not present

## 2017-12-24 DIAGNOSIS — R109 Unspecified abdominal pain: Secondary | ICD-10-CM | POA: Diagnosis not present

## 2017-12-24 DIAGNOSIS — Z8719 Personal history of other diseases of the digestive system: Secondary | ICD-10-CM | POA: Diagnosis not present

## 2018-01-22 DIAGNOSIS — H34833 Tributary (branch) retinal vein occlusion, bilateral, with macular edema: Secondary | ICD-10-CM | POA: Diagnosis not present

## 2018-01-22 DIAGNOSIS — H35031 Hypertensive retinopathy, right eye: Secondary | ICD-10-CM | POA: Diagnosis not present

## 2018-01-22 DIAGNOSIS — H43813 Vitreous degeneration, bilateral: Secondary | ICD-10-CM | POA: Diagnosis not present

## 2018-01-22 DIAGNOSIS — H3563 Retinal hemorrhage, bilateral: Secondary | ICD-10-CM | POA: Diagnosis not present

## 2018-01-29 DIAGNOSIS — M542 Cervicalgia: Secondary | ICD-10-CM | POA: Diagnosis not present

## 2018-01-29 DIAGNOSIS — Z1231 Encounter for screening mammogram for malignant neoplasm of breast: Secondary | ICD-10-CM | POA: Diagnosis not present

## 2018-01-29 DIAGNOSIS — Z853 Personal history of malignant neoplasm of breast: Secondary | ICD-10-CM | POA: Diagnosis not present

## 2018-02-05 DIAGNOSIS — M542 Cervicalgia: Secondary | ICD-10-CM | POA: Diagnosis not present

## 2018-02-12 DIAGNOSIS — M542 Cervicalgia: Secondary | ICD-10-CM | POA: Diagnosis not present

## 2018-02-26 DIAGNOSIS — M542 Cervicalgia: Secondary | ICD-10-CM | POA: Diagnosis not present

## 2018-03-05 DIAGNOSIS — M542 Cervicalgia: Secondary | ICD-10-CM | POA: Diagnosis not present

## 2018-03-17 DIAGNOSIS — R109 Unspecified abdominal pain: Secondary | ICD-10-CM | POA: Diagnosis not present

## 2018-03-18 DIAGNOSIS — R109 Unspecified abdominal pain: Secondary | ICD-10-CM | POA: Diagnosis not present

## 2018-03-19 DIAGNOSIS — R194 Change in bowel habit: Secondary | ICD-10-CM | POA: Diagnosis not present

## 2018-03-20 DIAGNOSIS — M542 Cervicalgia: Secondary | ICD-10-CM | POA: Diagnosis not present

## 2018-03-21 DIAGNOSIS — H3563 Retinal hemorrhage, bilateral: Secondary | ICD-10-CM | POA: Diagnosis not present

## 2018-03-21 DIAGNOSIS — H34833 Tributary (branch) retinal vein occlusion, bilateral, with macular edema: Secondary | ICD-10-CM | POA: Diagnosis not present

## 2018-03-21 DIAGNOSIS — H43813 Vitreous degeneration, bilateral: Secondary | ICD-10-CM | POA: Diagnosis not present

## 2018-03-21 DIAGNOSIS — H35031 Hypertensive retinopathy, right eye: Secondary | ICD-10-CM | POA: Diagnosis not present

## 2018-03-24 DIAGNOSIS — D1801 Hemangioma of skin and subcutaneous tissue: Secondary | ICD-10-CM | POA: Diagnosis not present

## 2018-03-24 DIAGNOSIS — L814 Other melanin hyperpigmentation: Secondary | ICD-10-CM | POA: Diagnosis not present

## 2018-03-24 DIAGNOSIS — L570 Actinic keratosis: Secondary | ICD-10-CM | POA: Diagnosis not present

## 2018-03-24 DIAGNOSIS — L821 Other seborrheic keratosis: Secondary | ICD-10-CM | POA: Diagnosis not present

## 2018-03-24 DIAGNOSIS — D225 Melanocytic nevi of trunk: Secondary | ICD-10-CM | POA: Diagnosis not present

## 2018-03-24 DIAGNOSIS — Z85828 Personal history of other malignant neoplasm of skin: Secondary | ICD-10-CM | POA: Diagnosis not present

## 2018-03-24 DIAGNOSIS — Z872 Personal history of diseases of the skin and subcutaneous tissue: Secondary | ICD-10-CM | POA: Diagnosis not present

## 2018-03-24 DIAGNOSIS — L820 Inflamed seborrheic keratosis: Secondary | ICD-10-CM | POA: Diagnosis not present

## 2018-03-24 DIAGNOSIS — R202 Paresthesia of skin: Secondary | ICD-10-CM | POA: Diagnosis not present

## 2018-03-24 DIAGNOSIS — B079 Viral wart, unspecified: Secondary | ICD-10-CM | POA: Diagnosis not present

## 2018-03-26 DIAGNOSIS — M542 Cervicalgia: Secondary | ICD-10-CM | POA: Diagnosis not present

## 2018-04-07 DIAGNOSIS — M542 Cervicalgia: Secondary | ICD-10-CM | POA: Diagnosis not present

## 2018-04-16 DIAGNOSIS — M542 Cervicalgia: Secondary | ICD-10-CM | POA: Diagnosis not present

## 2018-04-23 DIAGNOSIS — M542 Cervicalgia: Secondary | ICD-10-CM | POA: Diagnosis not present

## 2018-05-16 DIAGNOSIS — H34833 Tributary (branch) retinal vein occlusion, bilateral, with macular edema: Secondary | ICD-10-CM | POA: Diagnosis not present

## 2018-05-16 DIAGNOSIS — H35033 Hypertensive retinopathy, bilateral: Secondary | ICD-10-CM | POA: Diagnosis not present

## 2018-05-16 DIAGNOSIS — H3563 Retinal hemorrhage, bilateral: Secondary | ICD-10-CM | POA: Diagnosis not present

## 2018-05-16 DIAGNOSIS — H43813 Vitreous degeneration, bilateral: Secondary | ICD-10-CM | POA: Diagnosis not present

## 2018-06-09 DIAGNOSIS — R7301 Impaired fasting glucose: Secondary | ICD-10-CM | POA: Diagnosis not present

## 2018-06-09 DIAGNOSIS — Z6837 Body mass index (BMI) 37.0-37.9, adult: Secondary | ICD-10-CM | POA: Diagnosis not present

## 2018-06-09 DIAGNOSIS — E7800 Pure hypercholesterolemia, unspecified: Secondary | ICD-10-CM | POA: Diagnosis not present

## 2018-06-09 DIAGNOSIS — K589 Irritable bowel syndrome without diarrhea: Secondary | ICD-10-CM | POA: Diagnosis not present

## 2018-07-11 DIAGNOSIS — S92355A Nondisplaced fracture of fifth metatarsal bone, left foot, initial encounter for closed fracture: Secondary | ICD-10-CM | POA: Diagnosis not present

## 2018-07-11 DIAGNOSIS — S8992XA Unspecified injury of left lower leg, initial encounter: Secondary | ICD-10-CM | POA: Diagnosis not present

## 2018-07-11 DIAGNOSIS — H34833 Tributary (branch) retinal vein occlusion, bilateral, with macular edema: Secondary | ICD-10-CM | POA: Diagnosis not present

## 2018-09-05 DIAGNOSIS — H34833 Tributary (branch) retinal vein occlusion, bilateral, with macular edema: Secondary | ICD-10-CM | POA: Diagnosis not present

## 2018-09-11 DIAGNOSIS — Z8719 Personal history of other diseases of the digestive system: Secondary | ICD-10-CM | POA: Diagnosis not present

## 2018-09-11 DIAGNOSIS — K5790 Diverticulosis of intestine, part unspecified, without perforation or abscess without bleeding: Secondary | ICD-10-CM | POA: Diagnosis not present

## 2018-10-07 DIAGNOSIS — H34833 Tributary (branch) retinal vein occlusion, bilateral, with macular edema: Secondary | ICD-10-CM | POA: Diagnosis not present

## 2018-10-07 DIAGNOSIS — H2513 Age-related nuclear cataract, bilateral: Secondary | ICD-10-CM | POA: Diagnosis not present

## 2018-10-21 DIAGNOSIS — H18413 Arcus senilis, bilateral: Secondary | ICD-10-CM | POA: Diagnosis not present

## 2018-10-21 DIAGNOSIS — H348332 Tributary (branch) retinal vein occlusion, bilateral, stable: Secondary | ICD-10-CM | POA: Diagnosis not present

## 2018-10-21 DIAGNOSIS — H25013 Cortical age-related cataract, bilateral: Secondary | ICD-10-CM | POA: Diagnosis not present

## 2018-10-21 DIAGNOSIS — H2512 Age-related nuclear cataract, left eye: Secondary | ICD-10-CM | POA: Diagnosis not present

## 2018-10-21 DIAGNOSIS — H2513 Age-related nuclear cataract, bilateral: Secondary | ICD-10-CM | POA: Diagnosis not present

## 2018-10-21 DIAGNOSIS — H25043 Posterior subcapsular polar age-related cataract, bilateral: Secondary | ICD-10-CM | POA: Diagnosis not present

## 2018-10-31 DIAGNOSIS — H34833 Tributary (branch) retinal vein occlusion, bilateral, with macular edema: Secondary | ICD-10-CM | POA: Diagnosis not present

## 2018-10-31 DIAGNOSIS — H43813 Vitreous degeneration, bilateral: Secondary | ICD-10-CM | POA: Diagnosis not present

## 2018-10-31 DIAGNOSIS — H2513 Age-related nuclear cataract, bilateral: Secondary | ICD-10-CM | POA: Diagnosis not present

## 2018-10-31 DIAGNOSIS — H35033 Hypertensive retinopathy, bilateral: Secondary | ICD-10-CM | POA: Diagnosis not present

## 2018-11-17 DIAGNOSIS — H2512 Age-related nuclear cataract, left eye: Secondary | ICD-10-CM | POA: Diagnosis not present

## 2018-11-18 DIAGNOSIS — H2511 Age-related nuclear cataract, right eye: Secondary | ICD-10-CM | POA: Diagnosis not present

## 2018-11-18 DIAGNOSIS — H2513 Age-related nuclear cataract, bilateral: Secondary | ICD-10-CM | POA: Diagnosis not present

## 2018-11-23 DIAGNOSIS — Z20828 Contact with and (suspected) exposure to other viral communicable diseases: Secondary | ICD-10-CM | POA: Diagnosis not present

## 2018-11-26 ENCOUNTER — Encounter (HOSPITAL_COMMUNITY): Payer: Self-pay | Admitting: Emergency Medicine

## 2018-11-26 ENCOUNTER — Emergency Department (HOSPITAL_COMMUNITY)
Admission: EM | Admit: 2018-11-26 | Discharge: 2018-11-26 | Disposition: A | Discharge status: Home / Self Care | Payer: PPO | Attending: Emergency Medicine | Admitting: Emergency Medicine

## 2018-11-26 ENCOUNTER — Other Ambulatory Visit: Payer: Self-pay

## 2018-11-26 ENCOUNTER — Emergency Department (HOSPITAL_COMMUNITY): Payer: PPO

## 2018-11-26 DIAGNOSIS — D72829 Elevated white blood cell count, unspecified: Secondary | ICD-10-CM

## 2018-11-26 DIAGNOSIS — R1031 Right lower quadrant pain: Secondary | ICD-10-CM | POA: Insufficient documentation

## 2018-11-26 DIAGNOSIS — K5730 Diverticulosis of large intestine without perforation or abscess without bleeding: Secondary | ICD-10-CM | POA: Diagnosis not present

## 2018-11-26 DIAGNOSIS — K439 Ventral hernia without obstruction or gangrene: Secondary | ICD-10-CM | POA: Diagnosis not present

## 2018-11-26 DIAGNOSIS — Z853 Personal history of malignant neoplasm of breast: Secondary | ICD-10-CM | POA: Insufficient documentation

## 2018-11-26 DIAGNOSIS — Z87891 Personal history of nicotine dependence: Secondary | ICD-10-CM | POA: Diagnosis not present

## 2018-11-26 LAB — CBC
HCT: 40.6 % (ref 36.0–46.0)
Hemoglobin: 13.4 g/dL (ref 12.0–15.0)
MCH: 33.1 pg (ref 26.0–34.0)
MCHC: 33 g/dL (ref 30.0–36.0)
MCV: 100.2 fL — ABNORMAL HIGH (ref 80.0–100.0)
Platelets: 262 10*3/uL (ref 150–400)
RBC: 4.05 MIL/uL (ref 3.87–5.11)
RDW: 13.7 % (ref 11.5–15.5)
WBC: 21.9 10*3/uL — ABNORMAL HIGH (ref 4.0–10.5)
nRBC: 0 % (ref 0.0–0.2)

## 2018-11-26 LAB — COMPREHENSIVE METABOLIC PANEL
ALT: 28 U/L (ref 0–44)
AST: 25 U/L (ref 15–41)
Albumin: 2.2 g/dL — ABNORMAL LOW (ref 3.5–5.0)
Alkaline Phosphatase: 225 U/L — ABNORMAL HIGH (ref 38–126)
Anion gap: 12 (ref 5–15)
BUN: 11 mg/dL (ref 8–23)
CO2: 23 mmol/L (ref 22–32)
Calcium: 8.5 mg/dL — ABNORMAL LOW (ref 8.9–10.3)
Chloride: 99 mmol/L (ref 98–111)
Creatinine, Ser: 0.81 mg/dL (ref 0.44–1.00)
GFR calc Af Amer: >60 mL/min (ref >60)
GFR calc non Af Amer: >60 mL/min (ref >60)
Glucose, Bld: 136 mg/dL — ABNORMAL HIGH (ref 70–99)
Potassium: 3 mmol/L — ABNORMAL LOW (ref 3.5–5.1)
Sodium: 134 mmol/L — ABNORMAL LOW (ref 135–145)
Total Bilirubin: 1.6 mg/dL — ABNORMAL HIGH (ref 0.3–1.2)
Total Protein: 7.1 g/dL (ref 6.5–8.1)

## 2018-11-26 LAB — URINALYSIS, ROUTINE W REFLEX MICROSCOPIC
Bilirubin Urine: NEGATIVE
Glucose, UA: NEGATIVE mg/dL
Hgb urine dipstick: NEGATIVE
Ketones, ur: 20 mg/dL — AB
Leukocytes,Ua: NEGATIVE
Nitrite: NEGATIVE
Protein, ur: NEGATIVE mg/dL
Specific Gravity, Urine: 1.006 (ref 1.005–1.030)
pH: 7 (ref 5.0–8.0)

## 2018-11-26 LAB — LIPASE, BLOOD: Lipase: 17 U/L (ref 11–51)

## 2018-11-26 MED ORDER — SODIUM CHLORIDE 0.9 % IV SOLN
INTRAVENOUS | Status: DC
  Administered 2018-11-26: 09:00:00 via INTRAVENOUS

## 2018-11-26 MED ORDER — HYDROCODONE-ACETAMINOPHEN 5-325 MG PO TABS: 1.0000 | ORAL_TABLET | Freq: Four times a day (QID) | ORAL | 0 refills | Status: AC | PRN

## 2018-11-26 MED ORDER — IOHEXOL 300 MG/ML  SOLN
100.0000 mL | Freq: Once | INTRAMUSCULAR | Status: AC
  Administered 2018-11-26: 100 mL via INTRAVENOUS

## 2018-11-26 MED ORDER — SODIUM CHLORIDE 0.9% FLUSH
3.0000 mL | Freq: Once | INTRAVENOUS | Status: AC
  Administered 2018-11-26: 3 mL via INTRAVENOUS

## 2018-11-26 NOTE — Discharge Instructions (Signed)
If you develop high fever, vomiting, diarrhea, worsening pain and can't hold anything down or shortness of breath please return to the ER.

## 2018-11-26 NOTE — ED Provider Notes (Signed)
[facility] EMERGENCY DEPARTMENT Provider Note   CSN: [id number] Arrival date & time: 11/26/18  0607     History   Chief Complaint Chief Complaint  Patient presents with  . Abdominal Pain    HPI Shannon [name] is a 68 y.o. female.     The history is provided by the patient.  Abdominal Pain Pain location:  RLQ Pain quality: aching, sharp, shooting and throbbing   Pain radiates to:  Does not radiate Pain severity:  Moderate Onset quality:  Gradual Duration:  1 day Timing:  Constant Progression:  Worsening Chronicity:  New Context: not diet changes and not sick contacts   Context comment:  Patient states for the last 1 week she has had symptoms of general malaise, hot and cold chills but the abdominal pain did not start until yesterday Relieved by: being still. Worsened by:  Coughing and movement Ineffective treatments:  Acetaminophen Associated symptoms: chills   Associated symptoms: no anorexia, no cough, no diarrhea, no dysuria, no fever, no nausea, no shortness of breath and no vomiting   Risk factors: multiple surgeries   Risk factors comment:  History of diverticulitis status post resection and ostomy with then reanastomosis   Past Medical History:  Diagnosis Date  . Allergy   . Arthritis   . Breast cancer ([place])    1991right breast  . Colon polyps   . Diverticulitis   . Gallstones   . GERD (gastroesophageal reflux disease)   . Headache    occasionally  . Obesity     Patient Active Problem List   Diagnosis Date Noted  . 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 Diverticulitis of sigmoid colon 07/06/2015  . Leukocytosis 03/30/2015  . Hypokalemia 03/30/2015  . Diverticulitis of intestine with perforation without bleeding 03/29/2015  . GERD 03/25/2008    Past Surgical History:  Procedure Laterality Date  . ABDOMINAL HYSTERECTOMY    . CHOLECYSTECTOMY    . COLECTOMY WITH COLOSTOMY CREATION/HARTMANN PROCEDURE N/A 07/07/2015   Procedure: COLECTOMY WITH COLOSTOMY CREATION/HARTMANN PROCEDURE;  Surgeon: Judeth Horn, MD;  Location: [place];  Service: General;  Laterality: N/A;  . COLOSTOMY Left 07/07/2015   Procedure: COLOSTOMY;  Surgeon: Judeth Horn, MD;  Location: [place];  Service: General;  Laterality: Left;  . COLOSTOMY REVERSAL N/A 12/19/2015   Procedure: REVERSAL OF COLOSTOMY;  Surgeon: Judeth Horn, MD;  Location: [place];  Service: General;  Laterality: N/A;  . MASTECTOMY Right    with bilateral implants  . TUBAL LIGATION       OB History   No obstetric history on file.      Home Medications    Prior to Admission medications   Medication Sig Start Date End Date Taking? Authorizing Provider  alum & mag hydroxide-simeth (MAALOX/MYLANTA) 200-200-20 MG/5ML suspension Take 15 mLs by mouth every 4 (four) hours as needed for indigestion or heartburn. 04/02/15   Robbie Lis, MD  aspirin-acetaminophen-caffeine [facility] MIGRAINE) [phone] MG tablet Take 1 tablet by mouth every 6 (six) hours as needed for headache.    [provider]  CALCIUM PO Take 1,200 mg by mouth daily.     [provider]  celecoxib (CELEBREX) 400 MG capsule Take 400 mg by mouth daily after breakfast.    [provider]  cetirizine (ZYRTEC) 10 MG tablet Take 10 mg by mouth daily as needed for allergies.    [provider]  Multiple Vitamin (MULTIVITAMIN WITH MINERALS) TABS tablet Take 1 tablet by mouth daily.  [provider]  Omega-3 Fatty Acids (FISH OIL PO) Take 1,200 mg by mouth daily.     [provider]  omeprazole (PRILOSEC) 40 MG capsule Take 40 mg by mouth at bedtime.    [provider]  Polyethyl Glycol-Propyl Glycol (SYSTANE OP) Apply 1-2 drops to eye daily as needed (dry eyes).    [provider]  traMADol (ULTRAM) 50 MG tablet Take 2 tablets (100 mg total) by mouth every 6 (six) hours as needed for moderate pain or severe pain. 12/23/15   Judeth Horn, MD   traMADol-acetaminophen (ULTRACET) 37.5-325 MG tablet Take 1-2 tablets by mouth every 6 (six) hours as needed for moderate pain.    [provider]    Family History Family History  Problem Relation Age of Onset  . Dementia Mother   . Skin cancer Father   . Pancreatic cancer Father   . Gastric cancer Maternal Grandmother   . Diverticulitis Paternal Grandmother     Social History Social History   Tobacco Use  . Smoking status: Former Smoker    Quit date: 04/09/2005    Years since quitting: 13.6  . Smokeless tobacco: Never Used  Substance Use Topics  . Alcohol use: No    Alcohol/week: 0.0 standard drinks  . Drug use: No     Allergies   Codeine, Morphine and related, and Oxycodone   Review of Systems Review of Systems  Constitutional: Positive for chills. Negative for fever.  Respiratory: Negative for cough and shortness of breath.   Gastrointestinal: Positive for abdominal pain. Negative for anorexia, diarrhea, nausea and vomiting.  Genitourinary: Negative for dysuria.  All other systems reviewed and are negative.    Physical Exam Updated Vital Signs BP 106/90 (BP Location: Left Arm)   Pulse (!) 119   Temp 99.7 F (37.6 C) (Oral)   Resp 14   Ht 5\' 2"  (1.575 m)   Wt 81.6 kg   SpO2 99%   BMI 32.92 kg/m   Physical Exam Vitals signs and nursing note reviewed.  Constitutional:      General: She is not in acute distress.    Appearance: She is well-developed and normal weight.  HENT:     Head: Normocephalic and atraumatic.  Eyes:     Conjunctiva/sclera: Conjunctivae normal.     Pupils: Pupils are equal, round, and reactive to light.  Neck:     Musculoskeletal: Normal range of motion and neck supple.  Cardiovascular:     Rate and Rhythm: Normal rate and regular rhythm.     Heart sounds: No murmur.  Pulmonary:     Effort: Pulmonary effort is normal. No respiratory distress.     Breath sounds: Normal breath sounds. No wheezing or rales.  Abdominal:      General: There is no distension.     Palpations: Abdomen is soft.     Tenderness: There is abdominal tenderness in the right lower quadrant. There is guarding. There is no right CVA tenderness, left CVA tenderness or rebound.     Hernia: No hernia is present.  Musculoskeletal: Normal range of motion.        General: No tenderness.  Skin:    General: Skin is warm and dry.     Findings: No erythema or rash.  Neurological:     Mental Status: She is alert and oriented to person, place, and time.  Psychiatric:        Behavior: Behavior normal.      ED Treatments /  Results  Labs (all labs ordered are listed, but only abnormal results are displayed) Labs Reviewed  COMPREHENSIVE METABOLIC PANEL - Abnormal; Notable for the following components:      Result Value   Sodium 134 (*)    Potassium 3.0 (*)    Glucose, Bld 136 (*)    Calcium 8.5 (*)    Albumin 2.2 (*)    Alkaline Phosphatase 225 (*)    Total Bilirubin 1.6 (*)    All other components within normal limits  CBC - Abnormal; Notable for the following components:   WBC 21.9 (*)    MCV 100.2 (*)    All other components within normal limits  URINALYSIS, ROUTINE W REFLEX MICROSCOPIC - Abnormal; Notable for the following components:   Ketones, ur 20 (*)    All other components within normal limits  LIPASE, BLOOD    EKG None  Radiology Ct Abdomen Pelvis W Contrast  Result Date: 11/26/2018 CLINICAL DATA:  Abdominal pain, diverticulitis suspected. Patient complains of severe right lower quadrant pain with nausea and vomiting since Tuesday morning. History of bowel resection due to diverticulitis. EXAM: CT ABDOMEN AND PELVIS WITH CONTRAST TECHNIQUE: Multidetector CT imaging of the abdomen and pelvis was performed using the standard protocol following bolus administration of intravenous contrast. CONTRAST:  OMNIPAQUE IOHEXOL 300; 100 ml given COMPARISON:  CT abdomen pelvis 07/06/2015 FINDINGS: Lower chest: Bilateral breast  implants partially visualized. Minimal bibasilar opacities likely reflecting atelectasis. No pleural or pericardial effusion. Hepatobiliary: Numerable too small to characterize low-density lesions are seen throughout the liver likely reflecting cysts or biliary hamartomas, similar to prior. Status post cholecystectomy. No biliary dilatation. Pancreas: Unremarkable. No pancreatic ductal dilatation or surrounding inflammatory changes. Spleen: Normal in size without focal abnormality. Adrenals/Urinary Tract: Adrenal glands are unremarkable. No renal hydronephrosis. There is a small cyst in the left kidney. Bladder is unremarkable. Stomach/Bowel: Stomach is within normal limits. Appendix appears normal. Surgical changes are seen following sigmoid resection. No free air. No dilated loops of bowel to suggest obstruction. No evidence of bowel wall thickening or fat stranding to suggest inflammation. Scattered colonic diverticula without evidence of diverticulitis. Vascular/Lymphatic: No significant vascular findings are present. No enlarged abdominal or pelvic lymph nodes. Reproductive: Status post hysterectomy. No adnexal masses. Other: No ascites. Small left fat containing ventral hernia. Musculoskeletal: Multilevel degenerative changes in the lumbar spine. IMPRESSION: 1. No CT evidence for acute intra-abdominal process. 2. Small left fat containing ventral hernia. Electronically Signed   By: Audie Pinto M.D.   On: 11/26/2018 10:42    Procedures Procedures (including critical care time)  Medications Ordered in ED Medications  sodium chloride flush (NS) 0.9 % injection 3 mL (has no administration in time range)  0.9 %  sodium chloride infusion (has no administration in time range)     Initial Impression / Assessment and Plan / ED Course  I have reviewed the triage vital signs and the nursing notes.  Pertinent labs & imaging results that were available during my care of the patient were reviewed by me  and considered in my medical decision making (see chart for details).  Clinical Course as of Nov 26 1135  Wed Nov 26, 2018  1033 Specific Gravity, Urine: 1.006 [MM]    Clinical Course User Index [MM] Jeanmarie Hubert, MD      Elderly female presenting with abdominal pain today that started yesterday but in the setting of 1 week of general malaise and hot and cold chills.  Patient does have significant  history for diverticulitis with perforation and abscess requiring resection 3 years ago.  Patient was COVID tested earlier this week and came back negative.  Patient is denying any cough, congestion or shortness of breath.  Her biggest complaint is abdominal pain that is been present for approximately 24 hours and worsening.  It is worse with movement but she has had no vomiting and no significant change in her bowel movements.  She is still eating and does not feel like eating makes the pain worse.  Patient is tachycardic here to the 120s with a temperature of 99.7.  Vital signs are otherwise normal.  Patient has labs pending and plan will be for CT of the abdomen and pelvis.  Lower suspicion for kidney stone.  Concern for recurrent diverticulitis or still possibly COVID.  Patient does not want any pain control at this time she was started on normal saline.  8:59 AM Patient CBC is significant today with a white count of 21,000, CMP with mild hypokalemia 3.0 and mild elevated bilirubin at 1.6.  Lipase within normal limits.  CT pending.  11:37 AM Pt's CT is neg for acute process.  No signs of rash or shingles on the skin.  Pain is reproduced by moving and stretching and possible this is msk pain but unexplained leukocytosis and general malaise over the last few weeks. VS have improved. UA neg.  At this time feel pt can be d/ced but needs close follow with PCP or GI for recheck and repeat CBC.  No recent antibiotics concerning for c.diff and pt did have cataract surgery 1.5 weeks ago but didn't get  steroids that she knows of.    Final Clinical Impressions(s) / ED Diagnoses   Final diagnoses:  Right lower quadrant abdominal pain  Leukocytosis, unspecified type    ED Discharge Orders         Ordered    HYDROcodone-acetaminophen (NORCO/VICODIN) 5-325 MG tablet  Every 6 hours PRN     11/26/18 1142           Blanchie Dessert, MD 11/26/18 1144

## 2018-11-26 NOTE — ED Triage Notes (Signed)
Patient here with two day history of abdominal pain.  She states that it started on the left yesterday and went away.  She is now having pain on the right, having a lot of pain with movement.  Patient denies any nausea or vomiting or diarrhea.

## 2018-11-26 NOTE — ED Notes (Signed)
Patient verbalizes understanding of discharge instructions. Opportunity for questioning and answers were provided. Pt discharged from ED. 

## 2018-11-26 NOTE — ED Notes (Signed)
Urine culture sent with uranalysis.

## 2018-12-02 DIAGNOSIS — D72829 Elevated white blood cell count, unspecified: Secondary | ICD-10-CM | POA: Diagnosis not present

## 2018-12-02 DIAGNOSIS — E876 Hypokalemia: Secondary | ICD-10-CM | POA: Diagnosis not present

## 2018-12-10 DIAGNOSIS — R7989 Other specified abnormal findings of blood chemistry: Secondary | ICD-10-CM | POA: Diagnosis not present

## 2018-12-10 DIAGNOSIS — R1030 Lower abdominal pain, unspecified: Secondary | ICD-10-CM | POA: Diagnosis not present

## 2018-12-10 DIAGNOSIS — D473 Essential (hemorrhagic) thrombocythemia: Secondary | ICD-10-CM | POA: Diagnosis not present

## 2018-12-10 DIAGNOSIS — D72828 Other elevated white blood cell count: Secondary | ICD-10-CM | POA: Diagnosis not present

## 2018-12-10 DIAGNOSIS — R748 Abnormal levels of other serum enzymes: Secondary | ICD-10-CM | POA: Diagnosis not present

## 2018-12-10 DIAGNOSIS — Z9049 Acquired absence of other specified parts of digestive tract: Secondary | ICD-10-CM | POA: Diagnosis not present

## 2018-12-10 DIAGNOSIS — K5730 Diverticulosis of large intestine without perforation or abscess without bleeding: Secondary | ICD-10-CM | POA: Diagnosis not present

## 2018-12-10 DIAGNOSIS — R634 Abnormal weight loss: Secondary | ICD-10-CM | POA: Diagnosis not present

## 2018-12-16 DIAGNOSIS — Z6832 Body mass index (BMI) 32.0-32.9, adult: Secondary | ICD-10-CM | POA: Diagnosis not present

## 2018-12-16 DIAGNOSIS — R7301 Impaired fasting glucose: Secondary | ICD-10-CM | POA: Diagnosis not present

## 2018-12-16 DIAGNOSIS — Z0000 Encounter for general adult medical examination without abnormal findings: Secondary | ICD-10-CM | POA: Diagnosis not present

## 2018-12-16 DIAGNOSIS — Z23 Encounter for immunization: Secondary | ICD-10-CM | POA: Diagnosis not present

## 2018-12-16 DIAGNOSIS — R109 Unspecified abdominal pain: Secondary | ICD-10-CM | POA: Diagnosis not present

## 2018-12-16 DIAGNOSIS — R5383 Other fatigue: Secondary | ICD-10-CM | POA: Diagnosis not present

## 2018-12-16 DIAGNOSIS — E669 Obesity, unspecified: Secondary | ICD-10-CM | POA: Diagnosis not present

## 2018-12-16 DIAGNOSIS — J309 Allergic rhinitis, unspecified: Secondary | ICD-10-CM | POA: Diagnosis not present

## 2018-12-16 DIAGNOSIS — K589 Irritable bowel syndrome without diarrhea: Secondary | ICD-10-CM | POA: Diagnosis not present

## 2018-12-16 DIAGNOSIS — E7800 Pure hypercholesterolemia, unspecified: Secondary | ICD-10-CM | POA: Diagnosis not present

## 2018-12-16 DIAGNOSIS — M1990 Unspecified osteoarthritis, unspecified site: Secondary | ICD-10-CM | POA: Diagnosis not present

## 2018-12-16 DIAGNOSIS — Z7189 Other specified counseling: Secondary | ICD-10-CM | POA: Diagnosis not present

## 2018-12-30 DIAGNOSIS — R7301 Impaired fasting glucose: Secondary | ICD-10-CM | POA: Diagnosis not present

## 2018-12-30 DIAGNOSIS — R5383 Other fatigue: Secondary | ICD-10-CM | POA: Diagnosis not present

## 2018-12-30 DIAGNOSIS — E7800 Pure hypercholesterolemia, unspecified: Secondary | ICD-10-CM | POA: Diagnosis not present

## 2018-12-31 DIAGNOSIS — H3562 Retinal hemorrhage, left eye: Secondary | ICD-10-CM | POA: Diagnosis not present

## 2018-12-31 DIAGNOSIS — H43813 Vitreous degeneration, bilateral: Secondary | ICD-10-CM | POA: Diagnosis not present

## 2018-12-31 DIAGNOSIS — H34833 Tributary (branch) retinal vein occlusion, bilateral, with macular edema: Secondary | ICD-10-CM | POA: Diagnosis not present

## 2018-12-31 DIAGNOSIS — H2511 Age-related nuclear cataract, right eye: Secondary | ICD-10-CM | POA: Diagnosis not present

## 2019-01-15 ENCOUNTER — Other Ambulatory Visit: Payer: Self-pay

## 2019-01-15 DIAGNOSIS — Z20822 Contact with and (suspected) exposure to covid-19: Secondary | ICD-10-CM

## 2019-01-15 DIAGNOSIS — Z20828 Contact with and (suspected) exposure to other viral communicable diseases: Secondary | ICD-10-CM | POA: Diagnosis not present

## 2019-01-16 LAB — NOVEL CORONAVIRUS, NAA: SARS-CoV-2, NAA: NOT DETECTED

## 2019-02-02 DIAGNOSIS — M542 Cervicalgia: Secondary | ICD-10-CM | POA: Diagnosis not present

## 2019-02-02 DIAGNOSIS — J329 Chronic sinusitis, unspecified: Secondary | ICD-10-CM | POA: Diagnosis not present

## 2019-02-02 DIAGNOSIS — H2511 Age-related nuclear cataract, right eye: Secondary | ICD-10-CM | POA: Diagnosis not present

## 2019-02-02 DIAGNOSIS — Z7189 Other specified counseling: Secondary | ICD-10-CM | POA: Diagnosis not present

## 2019-02-02 DIAGNOSIS — H2513 Age-related nuclear cataract, bilateral: Secondary | ICD-10-CM | POA: Diagnosis not present

## 2019-02-04 DIAGNOSIS — Z1231 Encounter for screening mammogram for malignant neoplasm of breast: Secondary | ICD-10-CM | POA: Diagnosis not present

## 2019-02-04 DIAGNOSIS — Z853 Personal history of malignant neoplasm of breast: Secondary | ICD-10-CM | POA: Diagnosis not present

## 2019-02-24 DIAGNOSIS — H43813 Vitreous degeneration, bilateral: Secondary | ICD-10-CM | POA: Diagnosis not present

## 2019-02-24 DIAGNOSIS — H34833 Tributary (branch) retinal vein occlusion, bilateral, with macular edema: Secondary | ICD-10-CM | POA: Diagnosis not present

## 2019-02-24 DIAGNOSIS — H43392 Other vitreous opacities, left eye: Secondary | ICD-10-CM | POA: Diagnosis not present

## 2019-03-04 ENCOUNTER — Other Ambulatory Visit: Payer: Self-pay | Admitting: Family Medicine

## 2019-03-04 DIAGNOSIS — J329 Chronic sinusitis, unspecified: Secondary | ICD-10-CM

## 2019-03-17 ENCOUNTER — Ambulatory Visit
Admission: RE | Admit: 2019-03-17 | Discharge: 2019-03-17 | Disposition: A | Discharge status: Home / Self Care | Payer: PPO | Source: Ambulatory Visit | Attending: Family Medicine | Admitting: Family Medicine

## 2019-03-17 DIAGNOSIS — J329 Chronic sinusitis, unspecified: Secondary | ICD-10-CM | POA: Diagnosis not present

## 2019-03-17 MED ORDER — IOPAMIDOL (ISOVUE-300) INJECTION 61%
75.0000 mL | Freq: Once | INTRAVENOUS | Status: AC | PRN
  Administered 2019-03-17: 75 mL via INTRAVENOUS

## 2019-03-25 DIAGNOSIS — D1801 Hemangioma of skin and subcutaneous tissue: Secondary | ICD-10-CM | POA: Diagnosis not present

## 2019-03-25 DIAGNOSIS — D225 Melanocytic nevi of trunk: Secondary | ICD-10-CM | POA: Diagnosis not present

## 2019-03-25 DIAGNOSIS — L218 Other seborrheic dermatitis: Secondary | ICD-10-CM | POA: Diagnosis not present

## 2019-03-25 DIAGNOSIS — Z85828 Personal history of other malignant neoplasm of skin: Secondary | ICD-10-CM | POA: Diagnosis not present

## 2019-03-25 DIAGNOSIS — L820 Inflamed seborrheic keratosis: Secondary | ICD-10-CM | POA: Diagnosis not present

## 2019-03-25 DIAGNOSIS — R208 Other disturbances of skin sensation: Secondary | ICD-10-CM | POA: Diagnosis not present

## 2019-03-25 DIAGNOSIS — L821 Other seborrheic keratosis: Secondary | ICD-10-CM | POA: Diagnosis not present

## 2019-03-25 DIAGNOSIS — L814 Other melanin hyperpigmentation: Secondary | ICD-10-CM | POA: Diagnosis not present

## 2019-03-31 DIAGNOSIS — M542 Cervicalgia: Secondary | ICD-10-CM | POA: Diagnosis not present

## 2019-03-31 DIAGNOSIS — J310 Chronic rhinitis: Secondary | ICD-10-CM | POA: Diagnosis not present

## 2019-03-31 DIAGNOSIS — Z03818 Encounter for observation for suspected exposure to other biological agents ruled out: Secondary | ICD-10-CM | POA: Diagnosis not present

## 2019-03-31 DIAGNOSIS — K219 Gastro-esophageal reflux disease without esophagitis: Secondary | ICD-10-CM | POA: Diagnosis not present

## 2019-03-31 DIAGNOSIS — J3489 Other specified disorders of nose and nasal sinuses: Secondary | ICD-10-CM | POA: Diagnosis not present

## 2019-04-07 DIAGNOSIS — Z03818 Encounter for observation for suspected exposure to other biological agents ruled out: Secondary | ICD-10-CM | POA: Diagnosis not present

## 2019-04-21 DIAGNOSIS — H43813 Vitreous degeneration, bilateral: Secondary | ICD-10-CM | POA: Diagnosis not present

## 2019-04-21 DIAGNOSIS — H34833 Tributary (branch) retinal vein occlusion, bilateral, with macular edema: Secondary | ICD-10-CM | POA: Diagnosis not present

## 2019-04-21 DIAGNOSIS — H43392 Other vitreous opacities, left eye: Secondary | ICD-10-CM | POA: Diagnosis not present

## 2019-04-22 DIAGNOSIS — Z03818 Encounter for observation for suspected exposure to other biological agents ruled out: Secondary | ICD-10-CM | POA: Diagnosis not present

## 2019-04-27 DIAGNOSIS — Z03818 Encounter for observation for suspected exposure to other biological agents ruled out: Secondary | ICD-10-CM | POA: Diagnosis not present

## 2019-05-11 DIAGNOSIS — Z03818 Encounter for observation for suspected exposure to other biological agents ruled out: Secondary | ICD-10-CM | POA: Diagnosis not present

## 2019-05-13 DIAGNOSIS — Z03818 Encounter for observation for suspected exposure to other biological agents ruled out: Secondary | ICD-10-CM | POA: Diagnosis not present

## 2019-05-23 DIAGNOSIS — Z03818 Encounter for observation for suspected exposure to other biological agents ruled out: Secondary | ICD-10-CM | POA: Diagnosis not present

## 2019-06-01 DIAGNOSIS — Z03818 Encounter for observation for suspected exposure to other biological agents ruled out: Secondary | ICD-10-CM | POA: Diagnosis not present

## 2019-06-03 DIAGNOSIS — Z03818 Encounter for observation for suspected exposure to other biological agents ruled out: Secondary | ICD-10-CM | POA: Diagnosis not present

## 2019-06-16 DIAGNOSIS — H43813 Vitreous degeneration, bilateral: Secondary | ICD-10-CM | POA: Diagnosis not present

## 2019-06-16 DIAGNOSIS — H34833 Tributary (branch) retinal vein occlusion, bilateral, with macular edema: Secondary | ICD-10-CM | POA: Diagnosis not present

## 2019-06-16 DIAGNOSIS — H43392 Other vitreous opacities, left eye: Secondary | ICD-10-CM | POA: Diagnosis not present

## 2019-06-17 DIAGNOSIS — R7309 Other abnormal glucose: Secondary | ICD-10-CM | POA: Diagnosis not present

## 2019-06-17 DIAGNOSIS — E669 Obesity, unspecified: Secondary | ICD-10-CM | POA: Diagnosis not present

## 2019-06-17 DIAGNOSIS — E7800 Pure hypercholesterolemia, unspecified: Secondary | ICD-10-CM | POA: Diagnosis not present

## 2019-06-17 DIAGNOSIS — R030 Elevated blood-pressure reading, without diagnosis of hypertension: Secondary | ICD-10-CM | POA: Diagnosis not present

## 2019-06-17 DIAGNOSIS — K589 Irritable bowel syndrome without diarrhea: Secondary | ICD-10-CM | POA: Diagnosis not present

## 2019-07-01 DIAGNOSIS — Z03818 Encounter for observation for suspected exposure to other biological agents ruled out: Secondary | ICD-10-CM | POA: Diagnosis not present

## 2019-07-15 DIAGNOSIS — Z03818 Encounter for observation for suspected exposure to other biological agents ruled out: Secondary | ICD-10-CM | POA: Diagnosis not present

## 2019-07-29 DIAGNOSIS — Z03818 Encounter for observation for suspected exposure to other biological agents ruled out: Secondary | ICD-10-CM | POA: Diagnosis not present

## 2019-08-11 DIAGNOSIS — H43392 Other vitreous opacities, left eye: Secondary | ICD-10-CM | POA: Diagnosis not present

## 2019-08-11 DIAGNOSIS — H43813 Vitreous degeneration, bilateral: Secondary | ICD-10-CM | POA: Diagnosis not present

## 2019-08-11 DIAGNOSIS — H34833 Tributary (branch) retinal vein occlusion, bilateral, with macular edema: Secondary | ICD-10-CM | POA: Diagnosis not present

## 2019-08-12 DIAGNOSIS — Z03818 Encounter for observation for suspected exposure to other biological agents ruled out: Secondary | ICD-10-CM | POA: Diagnosis not present

## 2019-08-17 DIAGNOSIS — Z03818 Encounter for observation for suspected exposure to other biological agents ruled out: Secondary | ICD-10-CM | POA: Diagnosis not present

## 2019-08-24 DIAGNOSIS — Z03818 Encounter for observation for suspected exposure to other biological agents ruled out: Secondary | ICD-10-CM | POA: Diagnosis not present

## 2019-08-26 DIAGNOSIS — Z03818 Encounter for observation for suspected exposure to other biological agents ruled out: Secondary | ICD-10-CM | POA: Diagnosis not present

## 2019-10-07 DIAGNOSIS — H34833 Tributary (branch) retinal vein occlusion, bilateral, with macular edema: Secondary | ICD-10-CM | POA: Diagnosis not present

## 2019-10-07 DIAGNOSIS — H43813 Vitreous degeneration, bilateral: Secondary | ICD-10-CM | POA: Diagnosis not present

## 2019-10-07 DIAGNOSIS — H35033 Hypertensive retinopathy, bilateral: Secondary | ICD-10-CM | POA: Diagnosis not present

## 2019-10-07 DIAGNOSIS — H3562 Retinal hemorrhage, left eye: Secondary | ICD-10-CM | POA: Diagnosis not present

## 2019-12-02 DIAGNOSIS — H34833 Tributary (branch) retinal vein occlusion, bilateral, with macular edema: Secondary | ICD-10-CM | POA: Diagnosis not present

## 2019-12-02 DIAGNOSIS — H43813 Vitreous degeneration, bilateral: Secondary | ICD-10-CM | POA: Diagnosis not present

## 2019-12-02 DIAGNOSIS — H43392 Other vitreous opacities, left eye: Secondary | ICD-10-CM | POA: Diagnosis not present

## 2019-12-02 DIAGNOSIS — H3563 Retinal hemorrhage, bilateral: Secondary | ICD-10-CM | POA: Diagnosis not present

## 2019-12-23 DIAGNOSIS — K589 Irritable bowel syndrome without diarrhea: Secondary | ICD-10-CM | POA: Diagnosis not present

## 2019-12-23 DIAGNOSIS — Z7189 Other specified counseling: Secondary | ICD-10-CM | POA: Diagnosis not present

## 2019-12-23 DIAGNOSIS — E7800 Pure hypercholesterolemia, unspecified: Secondary | ICD-10-CM | POA: Diagnosis not present

## 2019-12-23 DIAGNOSIS — R7301 Impaired fasting glucose: Secondary | ICD-10-CM | POA: Diagnosis not present

## 2019-12-23 DIAGNOSIS — F322 Major depressive disorder, single episode, severe without psychotic features: Secondary | ICD-10-CM | POA: Diagnosis not present

## 2019-12-23 DIAGNOSIS — Z0000 Encounter for general adult medical examination without abnormal findings: Secondary | ICD-10-CM | POA: Diagnosis not present

## 2019-12-23 DIAGNOSIS — J309 Allergic rhinitis, unspecified: Secondary | ICD-10-CM | POA: Diagnosis not present

## 2019-12-23 DIAGNOSIS — M1990 Unspecified osteoarthritis, unspecified site: Secondary | ICD-10-CM | POA: Diagnosis not present

## 2020-01-07 IMAGING — CT CT ABDOMEN AND PELVIS WITH CONTRAST
2 of 5 series · 16 of 46 positions shown, 18 images · IV contrast (APPLIED)
Comparison: CT abdomen pelvis 07/06/2015

CLINICAL DATA: Abdominal pain, diverticulitis suspected. Patient
complains of severe right lower quadrant pain with nausea and
vomiting since [REDACTED] morning. History of bowel resection due to
diverticulitis.

EXAM:
CT ABDOMEN AND PELVIS WITH CONTRAST
TECHNIQUE: Multidetector CT imaging of the abdomen and pelvis was performed
using the standard protocol following bolus administration of
intravenous contrast.
CONTRAST:  OMNIPAQUE IOHEXOL 300; 100 ml given

[Series 3: abd/ pelvis 5.0 i30f 2 · axial · 0.86mm/px · z∈[+798,+1182]mm · 13 of 87 slices shown, 15 images]
[im 5/87  soft-tissue]
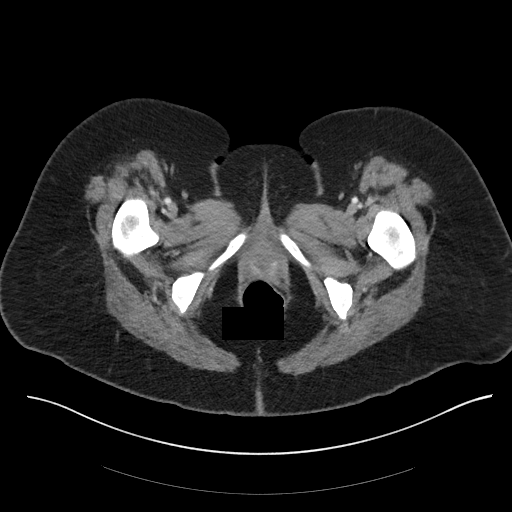
[im 5/87  bone]
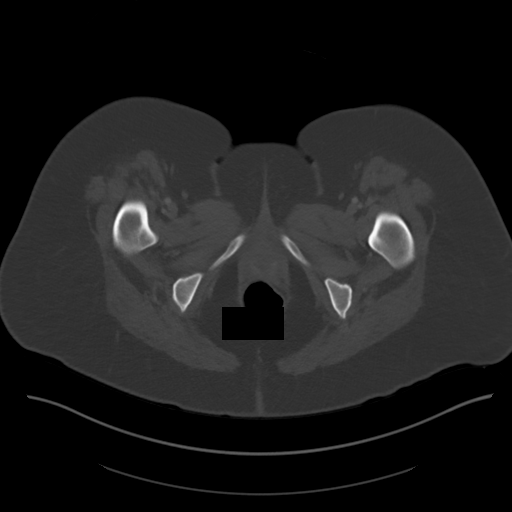
[im 14/87  soft-tissue]
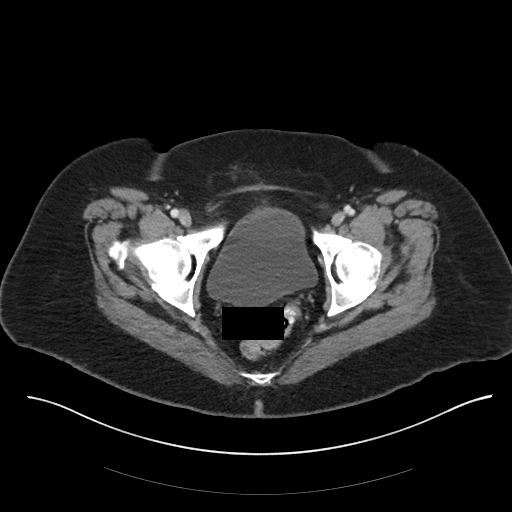
[im 19/87  soft-tissue]
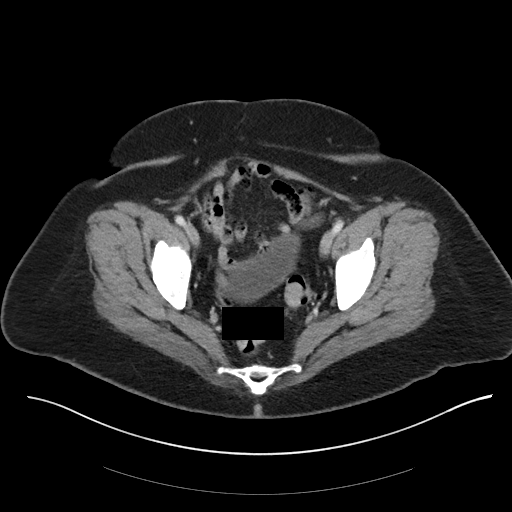
[im 23/87  soft-tissue]
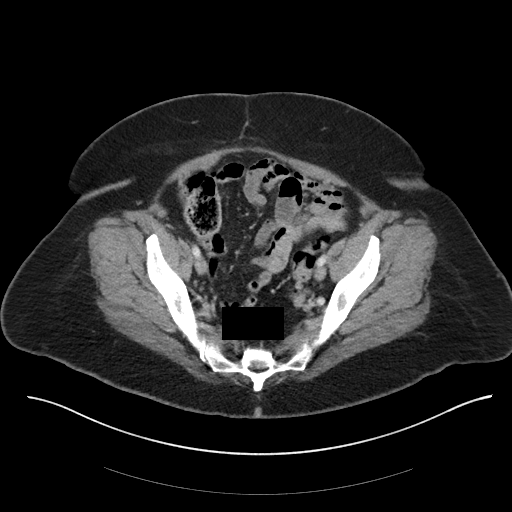
[im 32/87  soft-tissue]
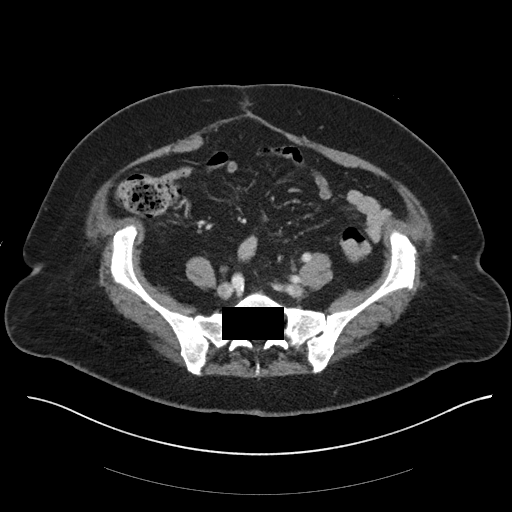
[im 37/87  soft-tissue]
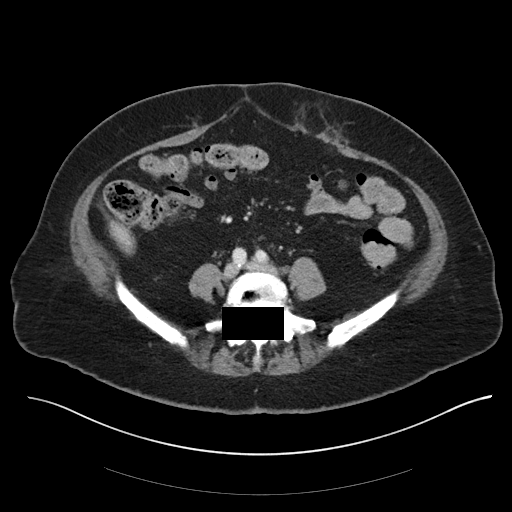
[im 46/87  soft-tissue]
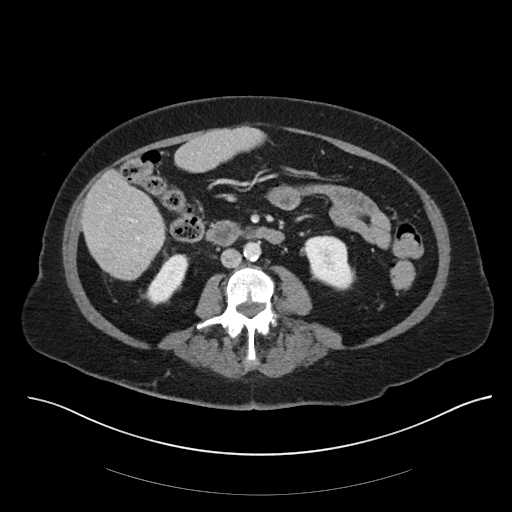
[im 50/87  soft-tissue]
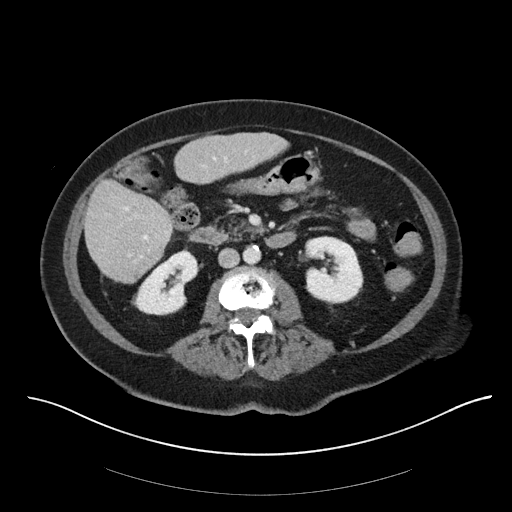
[im 55/87  soft-tissue]
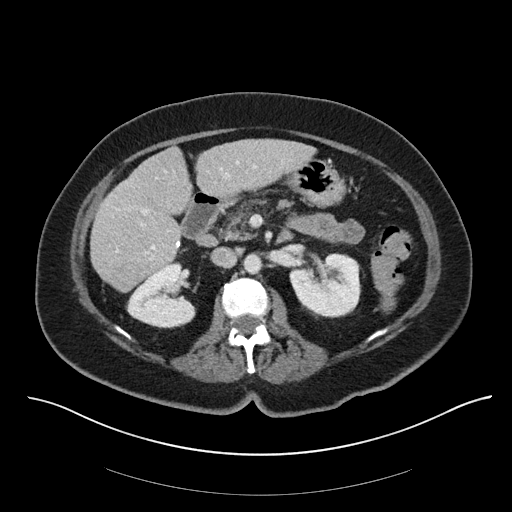
[im 55/87  bone]
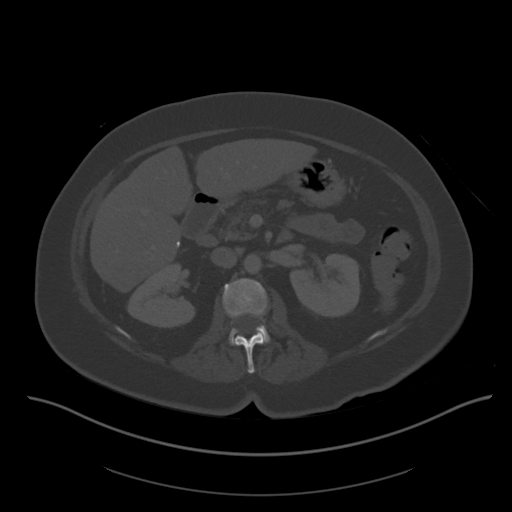
[im 64/87  soft-tissue]
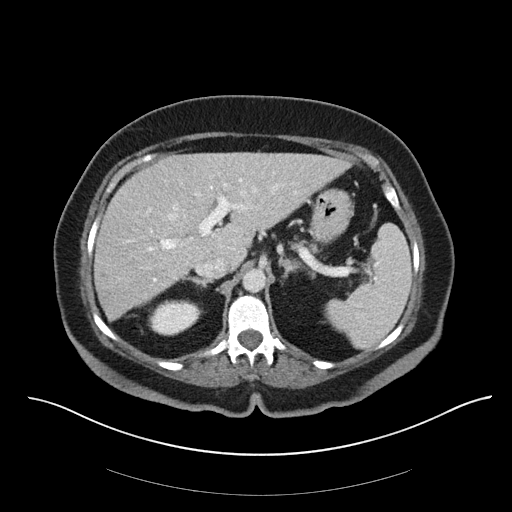
[im 68/87  soft-tissue]
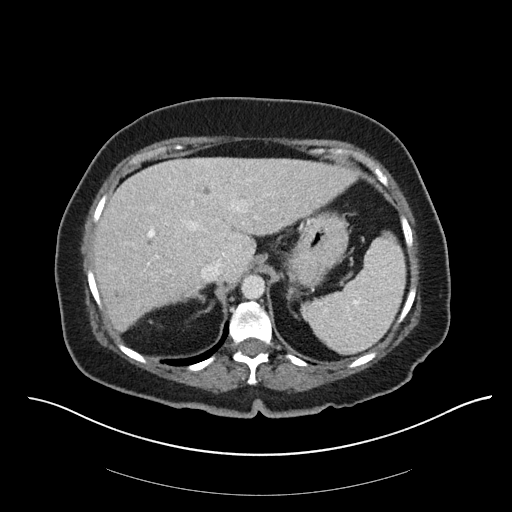
[im 73/87  soft-tissue]
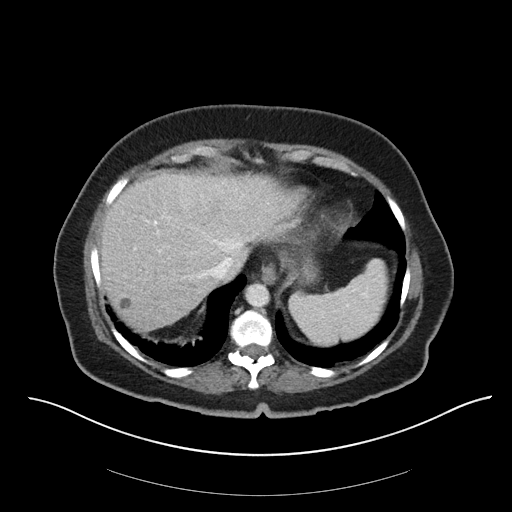
[im 82/87  soft-tissue]
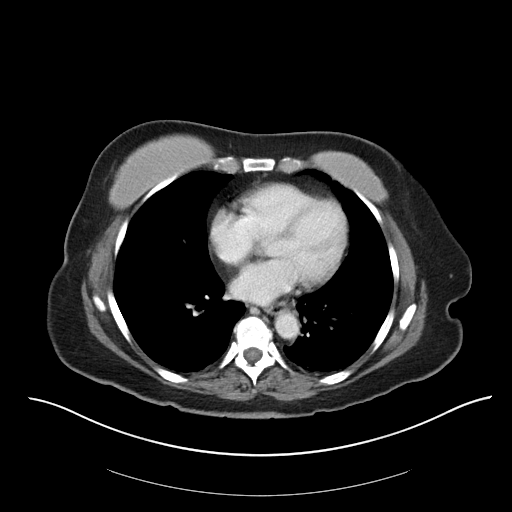

[Series 6: coronal soft tissue · coronal · 0.84mm/px · 3 of 101 slices shown]
[im 34/101  soft-tissue]
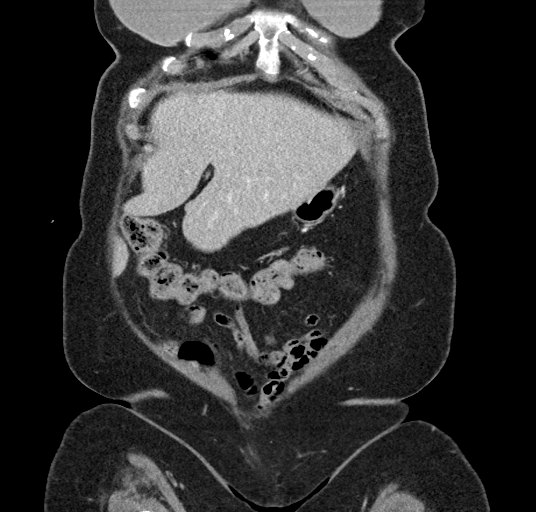
[im 45/101  soft-tissue]
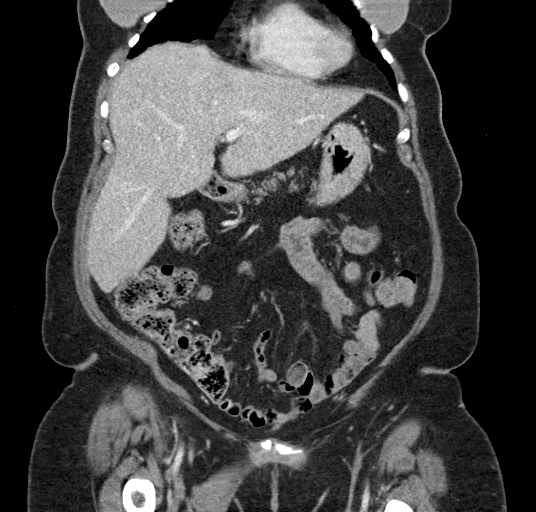
[im 56/101  soft-tissue]
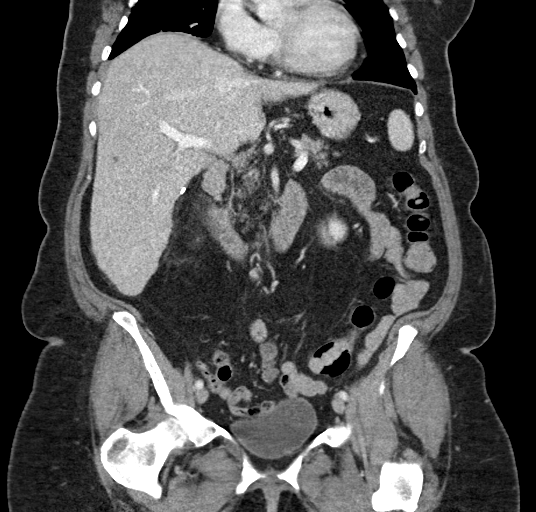

[16 of 46 positions shown; findings below may reference images not displayed]

FINDINGS: Lower chest: Bilateral breast implants partially visualized. Minimal
bibasilar opacities likely reflecting atelectasis. No pleural or
pericardial effusion.

Hepatobiliary: Numerable too small to characterize low-density
lesions are seen throughout the liver likely reflecting cysts or
biliary hamartomas, similar to prior. Status post cholecystectomy.
No biliary dilatation.

Pancreas: Unremarkable. No pancreatic ductal dilatation or
surrounding inflammatory changes.

Spleen: Normal in size without focal abnormality.

Adrenals/Urinary Tract: Adrenal glands are unremarkable. No renal
hydronephrosis. There is a small cyst in the left kidney. Bladder is
unremarkable.

Stomach/Bowel: Stomach is within normal limits. Appendix appears
normal. Surgical changes are seen following sigmoid resection. No
free air. No dilated loops of bowel to suggest obstruction. No
evidence of bowel wall thickening or fat stranding to suggest
inflammation. Scattered colonic diverticula without evidence of
diverticulitis.

Vascular/Lymphatic: No significant vascular findings are present. No
enlarged abdominal or pelvic lymph nodes.

Reproductive: Status post hysterectomy. No adnexal masses.

Other: No ascites. Small left fat containing ventral hernia.

Musculoskeletal: Multilevel degenerative changes in the lumbar
spine.
IMPRESSION: 1. No CT evidence for acute intra-abdominal process.
2. Small left fat containing ventral hernia.

## 2020-01-27 DIAGNOSIS — H43392 Other vitreous opacities, left eye: Secondary | ICD-10-CM | POA: Diagnosis not present

## 2020-01-27 DIAGNOSIS — H43813 Vitreous degeneration, bilateral: Secondary | ICD-10-CM | POA: Diagnosis not present

## 2020-01-27 DIAGNOSIS — H34833 Tributary (branch) retinal vein occlusion, bilateral, with macular edema: Secondary | ICD-10-CM | POA: Diagnosis not present

## 2020-01-29 DIAGNOSIS — M79601 Pain in right arm: Secondary | ICD-10-CM | POA: Diagnosis not present

## 2020-02-19 DIAGNOSIS — R200 Anesthesia of skin: Secondary | ICD-10-CM | POA: Diagnosis not present

## 2020-02-26 DIAGNOSIS — Z1231 Encounter for screening mammogram for malignant neoplasm of breast: Secondary | ICD-10-CM | POA: Diagnosis not present

## 2020-03-23 DIAGNOSIS — H43392 Other vitreous opacities, left eye: Secondary | ICD-10-CM | POA: Diagnosis not present

## 2020-03-23 DIAGNOSIS — H43813 Vitreous degeneration, bilateral: Secondary | ICD-10-CM | POA: Diagnosis not present

## 2020-03-23 DIAGNOSIS — H34833 Tributary (branch) retinal vein occlusion, bilateral, with macular edema: Secondary | ICD-10-CM | POA: Diagnosis not present

## 2020-03-24 DIAGNOSIS — Z85828 Personal history of other malignant neoplasm of skin: Secondary | ICD-10-CM | POA: Diagnosis not present

## 2020-03-24 DIAGNOSIS — L820 Inflamed seborrheic keratosis: Secondary | ICD-10-CM | POA: Diagnosis not present

## 2020-03-24 DIAGNOSIS — D225 Melanocytic nevi of trunk: Secondary | ICD-10-CM | POA: Diagnosis not present

## 2020-03-24 DIAGNOSIS — L905 Scar conditions and fibrosis of skin: Secondary | ICD-10-CM | POA: Diagnosis not present

## 2020-03-24 DIAGNOSIS — L821 Other seborrheic keratosis: Secondary | ICD-10-CM | POA: Diagnosis not present

## 2020-05-18 DIAGNOSIS — H34833 Tributary (branch) retinal vein occlusion, bilateral, with macular edema: Secondary | ICD-10-CM | POA: Diagnosis not present

## 2020-06-22 DIAGNOSIS — E669 Obesity, unspecified: Secondary | ICD-10-CM | POA: Diagnosis not present

## 2020-06-22 DIAGNOSIS — R7301 Impaired fasting glucose: Secondary | ICD-10-CM | POA: Diagnosis not present

## 2020-06-22 DIAGNOSIS — E7800 Pure hypercholesterolemia, unspecified: Secondary | ICD-10-CM | POA: Diagnosis not present

## 2020-07-13 DIAGNOSIS — H34833 Tributary (branch) retinal vein occlusion, bilateral, with macular edema: Secondary | ICD-10-CM | POA: Diagnosis not present

## 2020-07-13 DIAGNOSIS — H43813 Vitreous degeneration, bilateral: Secondary | ICD-10-CM | POA: Diagnosis not present

## 2020-07-13 DIAGNOSIS — H43392 Other vitreous opacities, left eye: Secondary | ICD-10-CM | POA: Diagnosis not present

## 2020-09-07 DIAGNOSIS — H34833 Tributary (branch) retinal vein occlusion, bilateral, with macular edema: Secondary | ICD-10-CM | POA: Diagnosis not present

## 2020-09-12 DIAGNOSIS — Z20828 Contact with and (suspected) exposure to other viral communicable diseases: Secondary | ICD-10-CM | POA: Diagnosis not present

## 2020-10-13 DIAGNOSIS — Z20822 Contact with and (suspected) exposure to covid-19: Secondary | ICD-10-CM | POA: Diagnosis not present

## 2020-11-02 DIAGNOSIS — H34833 Tributary (branch) retinal vein occlusion, bilateral, with macular edema: Secondary | ICD-10-CM | POA: Diagnosis not present

## 2020-11-02 DIAGNOSIS — H43392 Other vitreous opacities, left eye: Secondary | ICD-10-CM | POA: Diagnosis not present

## 2020-11-02 DIAGNOSIS — H43813 Vitreous degeneration, bilateral: Secondary | ICD-10-CM | POA: Diagnosis not present

## 2020-12-23 DIAGNOSIS — M1990 Unspecified osteoarthritis, unspecified site: Secondary | ICD-10-CM | POA: Diagnosis not present

## 2020-12-23 DIAGNOSIS — Z0000 Encounter for general adult medical examination without abnormal findings: Secondary | ICD-10-CM | POA: Diagnosis not present

## 2020-12-23 DIAGNOSIS — K589 Irritable bowel syndrome without diarrhea: Secondary | ICD-10-CM | POA: Diagnosis not present

## 2020-12-23 DIAGNOSIS — E7800 Pure hypercholesterolemia, unspecified: Secondary | ICD-10-CM | POA: Diagnosis not present

## 2020-12-23 DIAGNOSIS — J309 Allergic rhinitis, unspecified: Secondary | ICD-10-CM | POA: Diagnosis not present

## 2020-12-23 DIAGNOSIS — N644 Mastodynia: Secondary | ICD-10-CM | POA: Diagnosis not present

## 2020-12-23 DIAGNOSIS — F322 Major depressive disorder, single episode, severe without psychotic features: Secondary | ICD-10-CM | POA: Diagnosis not present

## 2020-12-23 DIAGNOSIS — R7301 Impaired fasting glucose: Secondary | ICD-10-CM | POA: Diagnosis not present

## 2020-12-26 ENCOUNTER — Other Ambulatory Visit: Payer: Self-pay | Admitting: Sports Medicine

## 2020-12-26 ENCOUNTER — Ambulatory Visit
Admission: RE | Admit: 2020-12-26 | Discharge: 2020-12-26 | Disposition: A | Discharge status: Home / Self Care | Payer: PPO | Source: Ambulatory Visit | Attending: Sports Medicine | Admitting: Sports Medicine

## 2020-12-26 DIAGNOSIS — M25561 Pain in right knee: Secondary | ICD-10-CM

## 2020-12-27 DIAGNOSIS — Z853 Personal history of malignant neoplasm of breast: Secondary | ICD-10-CM | POA: Diagnosis not present

## 2020-12-27 DIAGNOSIS — N644 Mastodynia: Secondary | ICD-10-CM | POA: Diagnosis not present

## 2020-12-28 DIAGNOSIS — H34833 Tributary (branch) retinal vein occlusion, bilateral, with macular edema: Secondary | ICD-10-CM | POA: Diagnosis not present

## 2020-12-29 DIAGNOSIS — Z20822 Contact with and (suspected) exposure to covid-19: Secondary | ICD-10-CM | POA: Diagnosis not present

## 2020-12-29 DIAGNOSIS — J069 Acute upper respiratory infection, unspecified: Secondary | ICD-10-CM | POA: Diagnosis not present

## 2021-02-21 DIAGNOSIS — M1711 Unilateral primary osteoarthritis, right knee: Secondary | ICD-10-CM | POA: Diagnosis not present

## 2021-02-22 DIAGNOSIS — H43392 Other vitreous opacities, left eye: Secondary | ICD-10-CM | POA: Diagnosis not present

## 2021-02-22 DIAGNOSIS — H43813 Vitreous degeneration, bilateral: Secondary | ICD-10-CM | POA: Diagnosis not present

## 2021-02-22 DIAGNOSIS — H34833 Tributary (branch) retinal vein occlusion, bilateral, with macular edema: Secondary | ICD-10-CM | POA: Diagnosis not present

## 2021-03-10 DIAGNOSIS — Z1231 Encounter for screening mammogram for malignant neoplasm of breast: Secondary | ICD-10-CM | POA: Diagnosis not present

## 2021-03-27 DIAGNOSIS — Z85828 Personal history of other malignant neoplasm of skin: Secondary | ICD-10-CM | POA: Diagnosis not present

## 2021-03-27 DIAGNOSIS — L298 Other pruritus: Secondary | ICD-10-CM | POA: Diagnosis not present

## 2021-03-27 DIAGNOSIS — L570 Actinic keratosis: Secondary | ICD-10-CM | POA: Diagnosis not present

## 2021-03-27 DIAGNOSIS — R208 Other disturbances of skin sensation: Secondary | ICD-10-CM | POA: Diagnosis not present

## 2021-03-27 DIAGNOSIS — Z872 Personal history of diseases of the skin and subcutaneous tissue: Secondary | ICD-10-CM | POA: Diagnosis not present

## 2021-03-27 DIAGNOSIS — L538 Other specified erythematous conditions: Secondary | ICD-10-CM | POA: Diagnosis not present

## 2021-03-27 DIAGNOSIS — L820 Inflamed seborrheic keratosis: Secondary | ICD-10-CM | POA: Diagnosis not present

## 2021-03-27 DIAGNOSIS — L814 Other melanin hyperpigmentation: Secondary | ICD-10-CM | POA: Diagnosis not present

## 2021-03-27 DIAGNOSIS — Z08 Encounter for follow-up examination after completed treatment for malignant neoplasm: Secondary | ICD-10-CM | POA: Diagnosis not present

## 2021-03-27 DIAGNOSIS — D225 Melanocytic nevi of trunk: Secondary | ICD-10-CM | POA: Diagnosis not present

## 2021-03-27 DIAGNOSIS — L821 Other seborrheic keratosis: Secondary | ICD-10-CM | POA: Diagnosis not present

## 2021-04-19 DIAGNOSIS — H43392 Other vitreous opacities, left eye: Secondary | ICD-10-CM | POA: Diagnosis not present

## 2021-04-19 DIAGNOSIS — H34832 Tributary (branch) retinal vein occlusion, left eye, with macular edema: Secondary | ICD-10-CM | POA: Diagnosis not present

## 2021-04-19 DIAGNOSIS — H43813 Vitreous degeneration, bilateral: Secondary | ICD-10-CM | POA: Diagnosis not present

## 2021-04-19 DIAGNOSIS — H34231 Retinal artery branch occlusion, right eye: Secondary | ICD-10-CM | POA: Diagnosis not present

## 2021-05-09 ENCOUNTER — Other Ambulatory Visit: Payer: Self-pay | Admitting: Gastroenterology

## 2021-05-09 DIAGNOSIS — Z8719 Personal history of other diseases of the digestive system: Secondary | ICD-10-CM

## 2021-05-09 DIAGNOSIS — R635 Abnormal weight gain: Secondary | ICD-10-CM | POA: Diagnosis not present

## 2021-05-09 DIAGNOSIS — Z862 Personal history of diseases of the blood and blood-forming organs and certain disorders involving the immune mechanism: Secondary | ICD-10-CM | POA: Diagnosis not present

## 2021-05-09 DIAGNOSIS — R1084 Generalized abdominal pain: Secondary | ICD-10-CM | POA: Diagnosis not present

## 2021-05-09 DIAGNOSIS — R7989 Other specified abnormal findings of blood chemistry: Secondary | ICD-10-CM | POA: Diagnosis not present

## 2021-05-12 DIAGNOSIS — R7301 Impaired fasting glucose: Secondary | ICD-10-CM | POA: Diagnosis not present

## 2021-05-12 DIAGNOSIS — E7800 Pure hypercholesterolemia, unspecified: Secondary | ICD-10-CM | POA: Diagnosis not present

## 2021-05-12 DIAGNOSIS — Z6836 Body mass index (BMI) 36.0-36.9, adult: Secondary | ICD-10-CM | POA: Diagnosis not present

## 2021-05-23 DIAGNOSIS — R059 Cough, unspecified: Secondary | ICD-10-CM | POA: Diagnosis not present

## 2021-05-23 DIAGNOSIS — J988 Other specified respiratory disorders: Secondary | ICD-10-CM | POA: Diagnosis not present

## 2021-05-23 DIAGNOSIS — J329 Chronic sinusitis, unspecified: Secondary | ICD-10-CM | POA: Diagnosis not present

## 2021-05-30 ENCOUNTER — Ambulatory Visit
Admission: RE | Admit: 2021-05-30 | Discharge: 2021-05-30 | Disposition: A | Discharge status: Home / Self Care | Payer: PPO | Source: Ambulatory Visit | Attending: Gastroenterology | Admitting: Gastroenterology

## 2021-05-30 ENCOUNTER — Other Ambulatory Visit: Payer: Self-pay

## 2021-05-30 DIAGNOSIS — K439 Ventral hernia without obstruction or gangrene: Secondary | ICD-10-CM | POA: Diagnosis not present

## 2021-05-30 DIAGNOSIS — Z8719 Personal history of other diseases of the digestive system: Secondary | ICD-10-CM

## 2021-05-30 DIAGNOSIS — K429 Umbilical hernia without obstruction or gangrene: Secondary | ICD-10-CM | POA: Diagnosis not present

## 2021-05-30 DIAGNOSIS — K449 Diaphragmatic hernia without obstruction or gangrene: Secondary | ICD-10-CM | POA: Diagnosis not present

## 2021-05-30 DIAGNOSIS — K760 Fatty (change of) liver, not elsewhere classified: Secondary | ICD-10-CM | POA: Diagnosis not present

## 2021-05-30 DIAGNOSIS — R1084 Generalized abdominal pain: Secondary | ICD-10-CM

## 2021-05-30 MED ORDER — IOPAMIDOL (ISOVUE-300) INJECTION 61%
100.0000 mL | Freq: Once | INTRAVENOUS | Status: AC | PRN
  Administered 2021-05-30: 100 mL via INTRAVENOUS

## 2021-05-31 DIAGNOSIS — H348312 Tributary (branch) retinal vein occlusion, right eye, stable: Secondary | ICD-10-CM | POA: Diagnosis not present

## 2021-05-31 DIAGNOSIS — H3562 Retinal hemorrhage, left eye: Secondary | ICD-10-CM | POA: Diagnosis not present

## 2021-05-31 DIAGNOSIS — H43813 Vitreous degeneration, bilateral: Secondary | ICD-10-CM | POA: Diagnosis not present

## 2021-05-31 DIAGNOSIS — H34832 Tributary (branch) retinal vein occlusion, left eye, with macular edema: Secondary | ICD-10-CM | POA: Diagnosis not present

## 2021-06-23 ENCOUNTER — Other Ambulatory Visit: Payer: Self-pay | Admitting: Family Medicine

## 2021-06-23 ENCOUNTER — Ambulatory Visit
Admission: RE | Admit: 2021-06-23 | Discharge: 2021-06-23 | Disposition: A | Discharge status: Home / Self Care | Payer: PPO | Source: Ambulatory Visit | Attending: Family Medicine | Admitting: Family Medicine

## 2021-06-23 DIAGNOSIS — R059 Cough, unspecified: Secondary | ICD-10-CM

## 2021-06-23 DIAGNOSIS — E7800 Pure hypercholesterolemia, unspecified: Secondary | ICD-10-CM | POA: Diagnosis not present

## 2021-06-23 DIAGNOSIS — K589 Irritable bowel syndrome without diarrhea: Secondary | ICD-10-CM | POA: Diagnosis not present

## 2021-06-23 DIAGNOSIS — K5790 Diverticulosis of intestine, part unspecified, without perforation or abscess without bleeding: Secondary | ICD-10-CM | POA: Diagnosis not present

## 2021-06-23 DIAGNOSIS — R7301 Impaired fasting glucose: Secondary | ICD-10-CM | POA: Diagnosis not present

## 2021-07-04 DIAGNOSIS — K760 Fatty (change of) liver, not elsewhere classified: Secondary | ICD-10-CM | POA: Diagnosis not present

## 2021-07-04 DIAGNOSIS — K589 Irritable bowel syndrome without diarrhea: Secondary | ICD-10-CM | POA: Diagnosis not present

## 2021-07-04 DIAGNOSIS — K5900 Constipation, unspecified: Secondary | ICD-10-CM | POA: Diagnosis not present

## 2021-07-12 DIAGNOSIS — H348312 Tributary (branch) retinal vein occlusion, right eye, stable: Secondary | ICD-10-CM | POA: Diagnosis not present

## 2021-07-12 DIAGNOSIS — H43813 Vitreous degeneration, bilateral: Secondary | ICD-10-CM | POA: Diagnosis not present

## 2021-07-12 DIAGNOSIS — H43392 Other vitreous opacities, left eye: Secondary | ICD-10-CM | POA: Diagnosis not present

## 2021-07-12 DIAGNOSIS — H34832 Tributary (branch) retinal vein occlusion, left eye, with macular edema: Secondary | ICD-10-CM | POA: Diagnosis not present

## 2021-07-19 DIAGNOSIS — R7301 Impaired fasting glucose: Secondary | ICD-10-CM | POA: Diagnosis not present

## 2021-07-19 DIAGNOSIS — Z6836 Body mass index (BMI) 36.0-36.9, adult: Secondary | ICD-10-CM | POA: Diagnosis not present

## 2021-07-19 DIAGNOSIS — E7800 Pure hypercholesterolemia, unspecified: Secondary | ICD-10-CM | POA: Diagnosis not present

## 2021-08-01 DIAGNOSIS — M1711 Unilateral primary osteoarthritis, right knee: Secondary | ICD-10-CM | POA: Diagnosis not present

## 2021-08-08 DIAGNOSIS — K5900 Constipation, unspecified: Secondary | ICD-10-CM | POA: Diagnosis not present

## 2021-08-08 DIAGNOSIS — K589 Irritable bowel syndrome without diarrhea: Secondary | ICD-10-CM | POA: Diagnosis not present

## 2021-08-22 DIAGNOSIS — H43392 Other vitreous opacities, left eye: Secondary | ICD-10-CM | POA: Diagnosis not present

## 2021-08-22 DIAGNOSIS — H348312 Tributary (branch) retinal vein occlusion, right eye, stable: Secondary | ICD-10-CM | POA: Diagnosis not present

## 2021-08-22 DIAGNOSIS — H43813 Vitreous degeneration, bilateral: Secondary | ICD-10-CM | POA: Diagnosis not present

## 2021-08-22 DIAGNOSIS — H34832 Tributary (branch) retinal vein occlusion, left eye, with macular edema: Secondary | ICD-10-CM | POA: Diagnosis not present

## 2021-10-13 DIAGNOSIS — H3562 Retinal hemorrhage, left eye: Secondary | ICD-10-CM | POA: Diagnosis not present

## 2021-10-13 DIAGNOSIS — H43813 Vitreous degeneration, bilateral: Secondary | ICD-10-CM | POA: Diagnosis not present

## 2021-10-13 DIAGNOSIS — H34832 Tributary (branch) retinal vein occlusion, left eye, with macular edema: Secondary | ICD-10-CM | POA: Diagnosis not present

## 2021-10-13 DIAGNOSIS — H348312 Tributary (branch) retinal vein occlusion, right eye, stable: Secondary | ICD-10-CM | POA: Diagnosis not present

## 2021-10-18 DIAGNOSIS — K5790 Diverticulosis of intestine, part unspecified, without perforation or abscess without bleeding: Secondary | ICD-10-CM | POA: Diagnosis not present

## 2021-10-18 DIAGNOSIS — Z6836 Body mass index (BMI) 36.0-36.9, adult: Secondary | ICD-10-CM | POA: Diagnosis not present

## 2021-10-18 DIAGNOSIS — R7301 Impaired fasting glucose: Secondary | ICD-10-CM | POA: Diagnosis not present

## 2021-10-18 DIAGNOSIS — E7800 Pure hypercholesterolemia, unspecified: Secondary | ICD-10-CM | POA: Diagnosis not present

## 2021-10-18 DIAGNOSIS — K5900 Constipation, unspecified: Secondary | ICD-10-CM | POA: Diagnosis not present

## 2021-12-06 DIAGNOSIS — H348312 Tributary (branch) retinal vein occlusion, right eye, stable: Secondary | ICD-10-CM | POA: Diagnosis not present

## 2021-12-06 DIAGNOSIS — H43813 Vitreous degeneration, bilateral: Secondary | ICD-10-CM | POA: Diagnosis not present

## 2021-12-06 DIAGNOSIS — H3562 Retinal hemorrhage, left eye: Secondary | ICD-10-CM | POA: Diagnosis not present

## 2021-12-06 DIAGNOSIS — H34832 Tributary (branch) retinal vein occlusion, left eye, with macular edema: Secondary | ICD-10-CM | POA: Diagnosis not present

## 2021-12-25 DIAGNOSIS — Z0000 Encounter for general adult medical examination without abnormal findings: Secondary | ICD-10-CM | POA: Diagnosis not present

## 2021-12-25 DIAGNOSIS — M1990 Unspecified osteoarthritis, unspecified site: Secondary | ICD-10-CM | POA: Diagnosis not present

## 2021-12-25 DIAGNOSIS — F331 Major depressive disorder, recurrent, moderate: Secondary | ICD-10-CM | POA: Diagnosis not present

## 2021-12-25 DIAGNOSIS — J309 Allergic rhinitis, unspecified: Secondary | ICD-10-CM | POA: Diagnosis not present

## 2021-12-25 DIAGNOSIS — R7301 Impaired fasting glucose: Secondary | ICD-10-CM | POA: Diagnosis not present

## 2021-12-25 DIAGNOSIS — K589 Irritable bowel syndrome without diarrhea: Secondary | ICD-10-CM | POA: Diagnosis not present

## 2021-12-25 DIAGNOSIS — E7800 Pure hypercholesterolemia, unspecified: Secondary | ICD-10-CM | POA: Diagnosis not present

## 2022-01-01 DIAGNOSIS — M1711 Unilateral primary osteoarthritis, right knee: Secondary | ICD-10-CM | POA: Diagnosis not present

## 2022-01-24 DIAGNOSIS — Z6836 Body mass index (BMI) 36.0-36.9, adult: Secondary | ICD-10-CM | POA: Diagnosis not present

## 2022-01-24 DIAGNOSIS — R7301 Impaired fasting glucose: Secondary | ICD-10-CM | POA: Diagnosis not present

## 2022-01-24 DIAGNOSIS — E7800 Pure hypercholesterolemia, unspecified: Secondary | ICD-10-CM | POA: Diagnosis not present

## 2022-01-24 DIAGNOSIS — K5790 Diverticulosis of intestine, part unspecified, without perforation or abscess without bleeding: Secondary | ICD-10-CM | POA: Diagnosis not present

## 2022-01-24 DIAGNOSIS — K5900 Constipation, unspecified: Secondary | ICD-10-CM | POA: Diagnosis not present

## 2022-01-24 DIAGNOSIS — K589 Irritable bowel syndrome without diarrhea: Secondary | ICD-10-CM | POA: Diagnosis not present

## 2022-01-31 DIAGNOSIS — H348312 Tributary (branch) retinal vein occlusion, right eye, stable: Secondary | ICD-10-CM | POA: Diagnosis not present

## 2022-01-31 DIAGNOSIS — H34832 Tributary (branch) retinal vein occlusion, left eye, with macular edema: Secondary | ICD-10-CM | POA: Diagnosis not present

## 2022-01-31 DIAGNOSIS — H43392 Other vitreous opacities, left eye: Secondary | ICD-10-CM | POA: Diagnosis not present

## 2022-01-31 DIAGNOSIS — H43813 Vitreous degeneration, bilateral: Secondary | ICD-10-CM | POA: Diagnosis not present

## 2022-02-09 DIAGNOSIS — Z09 Encounter for follow-up examination after completed treatment for conditions other than malignant neoplasm: Secondary | ICD-10-CM | POA: Diagnosis not present

## 2022-02-09 DIAGNOSIS — Z8719 Personal history of other diseases of the digestive system: Secondary | ICD-10-CM | POA: Diagnosis not present

## 2022-03-12 DIAGNOSIS — Z853 Personal history of malignant neoplasm of breast: Secondary | ICD-10-CM | POA: Diagnosis not present

## 2022-03-12 DIAGNOSIS — Z1231 Encounter for screening mammogram for malignant neoplasm of breast: Secondary | ICD-10-CM | POA: Diagnosis not present

## 2022-03-12 DIAGNOSIS — Z780 Asymptomatic menopausal state: Secondary | ICD-10-CM | POA: Diagnosis not present

## 2022-03-20 DIAGNOSIS — K5900 Constipation, unspecified: Secondary | ICD-10-CM | POA: Diagnosis not present

## 2022-03-20 DIAGNOSIS — Z6836 Body mass index (BMI) 36.0-36.9, adult: Secondary | ICD-10-CM | POA: Diagnosis not present

## 2022-03-20 DIAGNOSIS — K760 Fatty (change of) liver, not elsewhere classified: Secondary | ICD-10-CM | POA: Diagnosis not present

## 2022-03-20 DIAGNOSIS — K5790 Diverticulosis of intestine, part unspecified, without perforation or abscess without bleeding: Secondary | ICD-10-CM | POA: Diagnosis not present

## 2022-03-20 DIAGNOSIS — R7301 Impaired fasting glucose: Secondary | ICD-10-CM | POA: Diagnosis not present

## 2022-03-27 DIAGNOSIS — D485 Neoplasm of uncertain behavior of skin: Secondary | ICD-10-CM | POA: Diagnosis not present

## 2022-03-27 DIAGNOSIS — L309 Dermatitis, unspecified: Secondary | ICD-10-CM | POA: Diagnosis not present

## 2022-03-27 DIAGNOSIS — D225 Melanocytic nevi of trunk: Secondary | ICD-10-CM | POA: Diagnosis not present

## 2022-03-27 DIAGNOSIS — L821 Other seborrheic keratosis: Secondary | ICD-10-CM | POA: Diagnosis not present

## 2022-03-27 DIAGNOSIS — L814 Other melanin hyperpigmentation: Secondary | ICD-10-CM | POA: Diagnosis not present

## 2022-03-27 DIAGNOSIS — L570 Actinic keratosis: Secondary | ICD-10-CM | POA: Diagnosis not present

## 2022-03-27 DIAGNOSIS — L989 Disorder of the skin and subcutaneous tissue, unspecified: Secondary | ICD-10-CM | POA: Diagnosis not present

## 2022-04-06 DIAGNOSIS — H43392 Other vitreous opacities, left eye: Secondary | ICD-10-CM | POA: Diagnosis not present

## 2022-04-06 DIAGNOSIS — H43813 Vitreous degeneration, bilateral: Secondary | ICD-10-CM | POA: Diagnosis not present

## 2022-04-06 DIAGNOSIS — H34832 Tributary (branch) retinal vein occlusion, left eye, with macular edema: Secondary | ICD-10-CM | POA: Diagnosis not present

## 2022-05-22 DIAGNOSIS — Z6834 Body mass index (BMI) 34.0-34.9, adult: Secondary | ICD-10-CM | POA: Diagnosis not present

## 2022-05-22 DIAGNOSIS — R7301 Impaired fasting glucose: Secondary | ICD-10-CM | POA: Diagnosis not present

## 2022-06-08 DIAGNOSIS — H34832 Tributary (branch) retinal vein occlusion, left eye, with macular edema: Secondary | ICD-10-CM | POA: Diagnosis not present

## 2022-06-08 DIAGNOSIS — H43392 Other vitreous opacities, left eye: Secondary | ICD-10-CM | POA: Diagnosis not present

## 2022-06-08 DIAGNOSIS — H43813 Vitreous degeneration, bilateral: Secondary | ICD-10-CM | POA: Diagnosis not present

## 2022-06-08 DIAGNOSIS — E113291 Type 2 diabetes mellitus with mild nonproliferative diabetic retinopathy without macular edema, right eye: Secondary | ICD-10-CM | POA: Diagnosis not present

## 2022-06-25 DIAGNOSIS — M1990 Unspecified osteoarthritis, unspecified site: Secondary | ICD-10-CM | POA: Diagnosis not present

## 2022-06-25 DIAGNOSIS — E669 Obesity, unspecified: Secondary | ICD-10-CM | POA: Diagnosis not present

## 2022-06-25 DIAGNOSIS — E7800 Pure hypercholesterolemia, unspecified: Secondary | ICD-10-CM | POA: Diagnosis not present

## 2022-06-25 DIAGNOSIS — K589 Irritable bowel syndrome without diarrhea: Secondary | ICD-10-CM | POA: Diagnosis not present

## 2022-06-25 DIAGNOSIS — R7301 Impaired fasting glucose: Secondary | ICD-10-CM | POA: Diagnosis not present

## 2022-06-27 DIAGNOSIS — Z872 Personal history of diseases of the skin and subcutaneous tissue: Secondary | ICD-10-CM | POA: Diagnosis not present

## 2022-06-27 DIAGNOSIS — L309 Dermatitis, unspecified: Secondary | ICD-10-CM | POA: Diagnosis not present

## 2022-06-27 DIAGNOSIS — Z09 Encounter for follow-up examination after completed treatment for conditions other than malignant neoplasm: Secondary | ICD-10-CM | POA: Diagnosis not present

## 2022-07-24 DIAGNOSIS — E669 Obesity, unspecified: Secondary | ICD-10-CM | POA: Diagnosis not present

## 2022-07-24 DIAGNOSIS — R7301 Impaired fasting glucose: Secondary | ICD-10-CM | POA: Diagnosis not present

## 2022-07-24 DIAGNOSIS — E7800 Pure hypercholesterolemia, unspecified: Secondary | ICD-10-CM | POA: Diagnosis not present

## 2022-07-24 DIAGNOSIS — Z6834 Body mass index (BMI) 34.0-34.9, adult: Secondary | ICD-10-CM | POA: Diagnosis not present

## 2022-08-07 DIAGNOSIS — M1711 Unilateral primary osteoarthritis, right knee: Secondary | ICD-10-CM | POA: Diagnosis not present

## 2022-08-14 DIAGNOSIS — H34832 Tributary (branch) retinal vein occlusion, left eye, with macular edema: Secondary | ICD-10-CM | POA: Diagnosis not present

## 2022-08-14 DIAGNOSIS — H43813 Vitreous degeneration, bilateral: Secondary | ICD-10-CM | POA: Diagnosis not present

## 2022-08-14 DIAGNOSIS — H43392 Other vitreous opacities, left eye: Secondary | ICD-10-CM | POA: Diagnosis not present

## 2022-08-14 DIAGNOSIS — E113211 Type 2 diabetes mellitus with mild nonproliferative diabetic retinopathy with macular edema, right eye: Secondary | ICD-10-CM | POA: Diagnosis not present

## 2022-10-23 DIAGNOSIS — H43392 Other vitreous opacities, left eye: Secondary | ICD-10-CM | POA: Diagnosis not present

## 2022-10-23 DIAGNOSIS — H34832 Tributary (branch) retinal vein occlusion, left eye, with macular edema: Secondary | ICD-10-CM | POA: Diagnosis not present

## 2022-10-23 DIAGNOSIS — E113211 Type 2 diabetes mellitus with mild nonproliferative diabetic retinopathy with macular edema, right eye: Secondary | ICD-10-CM | POA: Diagnosis not present

## 2022-10-23 DIAGNOSIS — H43813 Vitreous degeneration, bilateral: Secondary | ICD-10-CM | POA: Diagnosis not present

## 2022-10-31 DIAGNOSIS — H34832 Tributary (branch) retinal vein occlusion, left eye, with macular edema: Secondary | ICD-10-CM | POA: Diagnosis not present

## 2022-10-31 DIAGNOSIS — E113211 Type 2 diabetes mellitus with mild nonproliferative diabetic retinopathy with macular edema, right eye: Secondary | ICD-10-CM | POA: Diagnosis not present

## 2022-11-27 DIAGNOSIS — E113211 Type 2 diabetes mellitus with mild nonproliferative diabetic retinopathy with macular edema, right eye: Secondary | ICD-10-CM | POA: Diagnosis not present

## 2022-12-31 DIAGNOSIS — M1711 Unilateral primary osteoarthritis, right knee: Secondary | ICD-10-CM | POA: Diagnosis not present

## 2022-12-31 DIAGNOSIS — M25561 Pain in right knee: Secondary | ICD-10-CM | POA: Diagnosis not present

## 2023-01-03 DIAGNOSIS — Z0000 Encounter for general adult medical examination without abnormal findings: Secondary | ICD-10-CM | POA: Diagnosis not present

## 2023-01-03 DIAGNOSIS — J309 Allergic rhinitis, unspecified: Secondary | ICD-10-CM | POA: Diagnosis not present

## 2023-01-03 DIAGNOSIS — K589 Irritable bowel syndrome without diarrhea: Secondary | ICD-10-CM | POA: Diagnosis not present

## 2023-01-03 DIAGNOSIS — F331 Major depressive disorder, recurrent, moderate: Secondary | ICD-10-CM | POA: Diagnosis not present

## 2023-01-03 DIAGNOSIS — E669 Obesity, unspecified: Secondary | ICD-10-CM | POA: Diagnosis not present

## 2023-01-03 DIAGNOSIS — M1990 Unspecified osteoarthritis, unspecified site: Secondary | ICD-10-CM | POA: Diagnosis not present

## 2023-01-03 DIAGNOSIS — R7301 Impaired fasting glucose: Secondary | ICD-10-CM | POA: Diagnosis not present

## 2023-01-03 DIAGNOSIS — E7800 Pure hypercholesterolemia, unspecified: Secondary | ICD-10-CM | POA: Diagnosis not present

## 2023-01-09 DIAGNOSIS — E113213 Type 2 diabetes mellitus with mild nonproliferative diabetic retinopathy with macular edema, bilateral: Secondary | ICD-10-CM | POA: Diagnosis not present

## 2023-01-09 DIAGNOSIS — H43813 Vitreous degeneration, bilateral: Secondary | ICD-10-CM | POA: Diagnosis not present

## 2023-01-09 DIAGNOSIS — H43392 Other vitreous opacities, left eye: Secondary | ICD-10-CM | POA: Diagnosis not present

## 2023-01-09 DIAGNOSIS — H34832 Tributary (branch) retinal vein occlusion, left eye, with macular edema: Secondary | ICD-10-CM | POA: Diagnosis not present

## 2023-01-09 DIAGNOSIS — Z9889 Other specified postprocedural states: Secondary | ICD-10-CM | POA: Diagnosis not present

## 2023-03-12 DIAGNOSIS — M1711 Unilateral primary osteoarthritis, right knee: Secondary | ICD-10-CM | POA: Diagnosis not present

## 2023-03-13 DIAGNOSIS — H43392 Other vitreous opacities, left eye: Secondary | ICD-10-CM | POA: Diagnosis not present

## 2023-03-13 DIAGNOSIS — E113213 Type 2 diabetes mellitus with mild nonproliferative diabetic retinopathy with macular edema, bilateral: Secondary | ICD-10-CM | POA: Diagnosis not present

## 2023-03-13 DIAGNOSIS — H34832 Tributary (branch) retinal vein occlusion, left eye, with macular edema: Secondary | ICD-10-CM | POA: Diagnosis not present

## 2023-03-13 DIAGNOSIS — H43813 Vitreous degeneration, bilateral: Secondary | ICD-10-CM | POA: Diagnosis not present

## 2023-03-18 DIAGNOSIS — Z1231 Encounter for screening mammogram for malignant neoplasm of breast: Secondary | ICD-10-CM | POA: Diagnosis not present

## 2023-07-03 DIAGNOSIS — E7800 Pure hypercholesterolemia, unspecified: Secondary | ICD-10-CM | POA: Diagnosis not present

## 2023-07-03 DIAGNOSIS — J309 Allergic rhinitis, unspecified: Secondary | ICD-10-CM | POA: Diagnosis not present

## 2023-07-03 DIAGNOSIS — R7301 Impaired fasting glucose: Secondary | ICD-10-CM | POA: Diagnosis not present

## 2023-07-17 DIAGNOSIS — H43813 Vitreous degeneration, bilateral: Secondary | ICD-10-CM | POA: Diagnosis not present

## 2023-07-17 DIAGNOSIS — E113213 Type 2 diabetes mellitus with mild nonproliferative diabetic retinopathy with macular edema, bilateral: Secondary | ICD-10-CM | POA: Diagnosis not present

## 2023-07-17 DIAGNOSIS — H43392 Other vitreous opacities, left eye: Secondary | ICD-10-CM | POA: Diagnosis not present

## 2023-07-17 DIAGNOSIS — H34832 Tributary (branch) retinal vein occlusion, left eye, with macular edema: Secondary | ICD-10-CM | POA: Diagnosis not present

## 2023-09-18 DIAGNOSIS — H43813 Vitreous degeneration, bilateral: Secondary | ICD-10-CM | POA: Diagnosis not present

## 2023-09-18 DIAGNOSIS — H43392 Other vitreous opacities, left eye: Secondary | ICD-10-CM | POA: Diagnosis not present

## 2023-09-18 DIAGNOSIS — E113213 Type 2 diabetes mellitus with mild nonproliferative diabetic retinopathy with macular edema, bilateral: Secondary | ICD-10-CM | POA: Diagnosis not present

## 2023-09-18 DIAGNOSIS — H34832 Tributary (branch) retinal vein occlusion, left eye, with macular edema: Secondary | ICD-10-CM | POA: Diagnosis not present

## 2023-11-21 DIAGNOSIS — E113213 Type 2 diabetes mellitus with mild nonproliferative diabetic retinopathy with macular edema, bilateral: Secondary | ICD-10-CM | POA: Diagnosis not present

## 2023-11-21 DIAGNOSIS — H43813 Vitreous degeneration, bilateral: Secondary | ICD-10-CM | POA: Diagnosis not present

## 2023-11-21 DIAGNOSIS — H34832 Tributary (branch) retinal vein occlusion, left eye, with macular edema: Secondary | ICD-10-CM | POA: Diagnosis not present

## 2023-11-21 DIAGNOSIS — H43392 Other vitreous opacities, left eye: Secondary | ICD-10-CM | POA: Diagnosis not present

## 2024-01-06 DIAGNOSIS — F331 Major depressive disorder, recurrent, moderate: Secondary | ICD-10-CM | POA: Diagnosis not present

## 2024-01-06 DIAGNOSIS — M1990 Unspecified osteoarthritis, unspecified site: Secondary | ICD-10-CM | POA: Diagnosis not present

## 2024-01-06 DIAGNOSIS — R7301 Impaired fasting glucose: Secondary | ICD-10-CM | POA: Diagnosis not present

## 2024-01-06 DIAGNOSIS — E7800 Pure hypercholesterolemia, unspecified: Secondary | ICD-10-CM | POA: Diagnosis not present

## 2024-01-06 DIAGNOSIS — Z1331 Encounter for screening for depression: Secondary | ICD-10-CM | POA: Diagnosis not present

## 2024-01-06 DIAGNOSIS — K589 Irritable bowel syndrome without diarrhea: Secondary | ICD-10-CM | POA: Diagnosis not present

## 2024-01-06 DIAGNOSIS — J309 Allergic rhinitis, unspecified: Secondary | ICD-10-CM | POA: Diagnosis not present

## 2024-01-06 DIAGNOSIS — Z0000 Encounter for general adult medical examination without abnormal findings: Secondary | ICD-10-CM | POA: Diagnosis not present

## 2024-01-22 DIAGNOSIS — E113213 Type 2 diabetes mellitus with mild nonproliferative diabetic retinopathy with macular edema, bilateral: Secondary | ICD-10-CM | POA: Diagnosis not present

## 2024-01-22 DIAGNOSIS — H34832 Tributary (branch) retinal vein occlusion, left eye, with macular edema: Secondary | ICD-10-CM | POA: Diagnosis not present

## 2024-01-22 DIAGNOSIS — H43813 Vitreous degeneration, bilateral: Secondary | ICD-10-CM | POA: Diagnosis not present

## 2024-01-22 DIAGNOSIS — H43392 Other vitreous opacities, left eye: Secondary | ICD-10-CM | POA: Diagnosis not present

## 2024-03-26 NOTE — Progress Notes (Addendum)
 Shannon [name]                                          MRN: [id number]   04/17/2024   The [facility] Quality Team Specialist reviewed this patient medical record for the purposes of chart review for care gap closure. The following were reviewed: chart review for care gap closure-kidney health evaluation for diabetes:eGFR  and uACR.    [facility] Quality Team
